# Patient Record
Sex: Female | Born: 1950 | Race: Black or African American | Hispanic: No | Smoking: Current every day smoker
Health system: Southern US, Community
[De-identification: ages and names within clinical notes are randomized; demographics above are authoritative.]

## PROBLEM LIST (undated history)

## (undated) DIAGNOSIS — K219 Gastro-esophageal reflux disease without esophagitis: Secondary | ICD-10-CM

## (undated) DIAGNOSIS — M25559 Pain in unspecified hip: Secondary | ICD-10-CM

## (undated) DIAGNOSIS — N183 Chronic kidney disease, stage 3 unspecified: Secondary | ICD-10-CM

## (undated) DIAGNOSIS — Z720 Tobacco use: Secondary | ICD-10-CM

## (undated) DIAGNOSIS — I509 Heart failure, unspecified: Secondary | ICD-10-CM

## (undated) DIAGNOSIS — I10 Essential (primary) hypertension: Secondary | ICD-10-CM

## (undated) DIAGNOSIS — I739 Peripheral vascular disease, unspecified: Secondary | ICD-10-CM

## (undated) DIAGNOSIS — I872 Venous insufficiency (chronic) (peripheral): Secondary | ICD-10-CM

## (undated) DIAGNOSIS — E119 Type 2 diabetes mellitus without complications: Secondary | ICD-10-CM

## (undated) DIAGNOSIS — T7840XA Allergy, unspecified, initial encounter: Secondary | ICD-10-CM

## (undated) DIAGNOSIS — R0609 Other forms of dyspnea: Secondary | ICD-10-CM

## (undated) DIAGNOSIS — E785 Hyperlipidemia, unspecified: Secondary | ICD-10-CM

## (undated) DIAGNOSIS — J309 Allergic rhinitis, unspecified: Secondary | ICD-10-CM

## (undated) DIAGNOSIS — E662 Morbid (severe) obesity with alveolar hypoventilation: Secondary | ICD-10-CM

## (undated) DIAGNOSIS — L602 Onychogryphosis: Secondary | ICD-10-CM

## (undated) DIAGNOSIS — K5900 Constipation, unspecified: Secondary | ICD-10-CM

## (undated) DIAGNOSIS — T300 Burn of unspecified body region, unspecified degree: Secondary | ICD-10-CM

## (undated) DIAGNOSIS — I6529 Occlusion and stenosis of unspecified carotid artery: Secondary | ICD-10-CM

## (undated) DIAGNOSIS — M179 Osteoarthritis of knee, unspecified: Secondary | ICD-10-CM

## (undated) DIAGNOSIS — G479 Sleep disorder, unspecified: Secondary | ICD-10-CM

## (undated) DIAGNOSIS — Z87442 Personal history of urinary calculi: Secondary | ICD-10-CM

## (undated) DIAGNOSIS — E079 Disorder of thyroid, unspecified: Secondary | ICD-10-CM

## (undated) DIAGNOSIS — Z9889 Other specified postprocedural states: Secondary | ICD-10-CM

## (undated) DIAGNOSIS — R112 Nausea with vomiting, unspecified: Secondary | ICD-10-CM

## (undated) DIAGNOSIS — J189 Pneumonia, unspecified organism: Secondary | ICD-10-CM

## (undated) DIAGNOSIS — E039 Hypothyroidism, unspecified: Secondary | ICD-10-CM

## (undated) DIAGNOSIS — F411 Generalized anxiety disorder: Secondary | ICD-10-CM

## (undated) HISTORY — DX: Hyperlipidemia, unspecified: E78.5

## (undated) HISTORY — DX: Venous insufficiency (chronic) (peripheral): I87.2

## (undated) HISTORY — DX: Gastro-esophageal reflux disease without esophagitis: K21.9

## (undated) HISTORY — DX: Other forms of dyspnea: R06.09

## (undated) HISTORY — DX: Type 2 diabetes mellitus without complications: E11.9

## (undated) HISTORY — DX: Generalized anxiety disorder: F41.1

## (undated) HISTORY — DX: Morbid (severe) obesity with alveolar hypoventilation: E66.2

## (undated) HISTORY — DX: Onychogryphosis: L60.2

## (undated) HISTORY — DX: Occlusion and stenosis of unspecified carotid artery: I65.29

## (undated) HISTORY — DX: Chronic kidney disease, stage 3 unspecified: N18.30

## (undated) HISTORY — DX: Sleep disorder, unspecified: G47.9

## (undated) HISTORY — DX: Disorder of thyroid, unspecified: E07.9

## (undated) HISTORY — DX: Osteoarthritis of knee, unspecified: M17.9

## (undated) HISTORY — DX: Allergy, unspecified, initial encounter: T78.40XA

## (undated) HISTORY — DX: Allergic rhinitis, unspecified: J30.9

## (undated) HISTORY — DX: Burn of unspecified body region, unspecified degree: T30.0

## (undated) HISTORY — DX: Peripheral vascular disease, unspecified: I73.9

## (undated) HISTORY — DX: Tobacco use: Z72.0

## (undated) HISTORY — DX: Constipation, unspecified: K59.00

## (undated) HISTORY — DX: Pain in unspecified hip: M25.559

## (undated) HISTORY — DX: Hypothyroidism, unspecified: E03.9

---

## 1998-03-19 ENCOUNTER — Inpatient Hospital Stay: Admission: EM | Admit: 1998-03-19 | Discharge: 1998-03-21 | Payer: Self-pay | Admitting: Emergency Medicine

## 1998-03-25 ENCOUNTER — Encounter (HOSPITAL_COMMUNITY): Admission: RE | Admit: 1998-03-25 | Discharge: 1998-06-23 | Payer: Self-pay | Admitting: Psychiatry

## 1998-08-24 ENCOUNTER — Encounter: Payer: Self-pay | Admitting: Emergency Medicine

## 1998-08-24 ENCOUNTER — Emergency Department (HOSPITAL_COMMUNITY): Admission: EM | Admit: 1998-08-24 | Discharge: 1998-08-24 | Payer: Self-pay | Admitting: Emergency Medicine

## 1999-12-01 ENCOUNTER — Inpatient Hospital Stay (HOSPITAL_COMMUNITY): Admission: EM | Admit: 1999-12-01 | Discharge: 1999-12-03 | Payer: Self-pay | Admitting: Emergency Medicine

## 1999-12-01 ENCOUNTER — Encounter: Payer: Self-pay | Admitting: Emergency Medicine

## 1999-12-02 ENCOUNTER — Encounter: Payer: Self-pay | Admitting: Internal Medicine

## 2001-06-30 ENCOUNTER — Other Ambulatory Visit: Admission: RE | Admit: 2001-06-30 | Discharge: 2001-06-30 | Payer: Self-pay | Admitting: Internal Medicine

## 2003-07-10 ENCOUNTER — Emergency Department (HOSPITAL_COMMUNITY): Admission: EM | Admit: 2003-07-10 | Discharge: 2003-07-11 | Payer: Self-pay | Admitting: *Deleted

## 2003-08-26 ENCOUNTER — Encounter: Admission: RE | Admit: 2003-08-26 | Discharge: 2003-08-26 | Payer: Self-pay | Admitting: Internal Medicine

## 2003-09-04 ENCOUNTER — Encounter: Admission: RE | Admit: 2003-09-04 | Discharge: 2003-09-04 | Payer: Self-pay | Admitting: Internal Medicine

## 2005-04-05 ENCOUNTER — Emergency Department (HOSPITAL_COMMUNITY): Admission: EM | Admit: 2005-04-05 | Discharge: 2005-04-05 | Payer: Self-pay | Admitting: *Deleted

## 2007-02-20 ENCOUNTER — Encounter: Admission: RE | Admit: 2007-02-20 | Discharge: 2007-02-20 | Payer: Self-pay | Admitting: Internal Medicine

## 2008-03-15 ENCOUNTER — Encounter: Admission: RE | Admit: 2008-03-15 | Discharge: 2008-03-15 | Payer: Self-pay | Admitting: Internal Medicine

## 2008-07-14 ENCOUNTER — Emergency Department (HOSPITAL_COMMUNITY): Admission: EM | Admit: 2008-07-14 | Discharge: 2008-07-14 | Payer: Self-pay | Admitting: Emergency Medicine

## 2009-04-22 ENCOUNTER — Encounter: Admission: RE | Admit: 2009-04-22 | Discharge: 2009-04-22 | Payer: Self-pay | Admitting: Internal Medicine

## 2010-06-14 ENCOUNTER — Encounter: Payer: Self-pay | Admitting: Internal Medicine

## 2010-06-23 ENCOUNTER — Ambulatory Visit
Admission: RE | Admit: 2010-06-23 | Discharge: 2010-06-23 | Payer: Self-pay | Source: Home / Self Care | Attending: Urology | Admitting: Urology

## 2010-06-23 LAB — POCT I-STAT 4, (NA,K, GLUC, HGB,HCT)
HCT: 41 % (ref 36.0–46.0)
Hemoglobin: 13.9 g/dL (ref 12.0–15.0)
Potassium: 3.5 mEq/L (ref 3.5–5.1)
Sodium: 144 mEq/L (ref 135–145)

## 2010-06-26 NOTE — Op Note (Signed)
NAMEGLENNDA, Heidi Maddox                 ACCOUNT NO.:  [id number]  MEDICAL RECORD NO.:  [id number]          PATIENT TYPE:  AMB  LOCATION:  [facility]                         FACILITY:  [facility]  PHYSICIAN:  Danae Chen, M.D.  DATE OF BIRTH:  08-Feb-1951  DATE OF PROCEDURE: DATE OF DISCHARGE:                              OPERATIVE REPORT   PREOPERATIVE DIAGNOSIS:  Left distal ureteral stone.  POSTOPERATIVE DIAGNOSIS:  Left distal ureteral stone.  PROCEDURE:  Cystoscopy, left retrograde pyelogram, ureteroscopy, holmium laser of left ureteral stone, stone extraction and insertion of double-J stent.  SURGEON:  Danae Chen, M.D.  ANESTHESIA:  General.  INDICATION:  The patient is a 60 year old year female who was found during workup of a recurrent urinary tract infection to have a 7 x 11 mm stone in the left distal ureter with moderate hydronephrosis.  She is scheduled today for cystoscopy, retrograde pyelogram, ureteroscopy, holmium laser of left ureteral stone and insertion of double-J stent. The procedure, the risks and benefits were discussed with the patient. The risks include, but are not limited to, hemorrhage, infection, ureteral injury, and inability to remove the stone.  She understands and gave informed consent.  The patient was identified by her wrist band, and proper time-out was taken.  Under general anesthesia she was prepped and draped and placed in the dorsal lithotomy position.  A panendoscope was inserted in the bladder. The bladder mucosa was normal.  There was no stone or tumor in the bladder.  The ureteral orifices were in normal position and shape.  RETROGRADE PYELOGRAM:  A cone-tip catheter was passed through the cystoscope and through the left ureteral orifice.  Contrast was then injected through the cone-tip catheter.  There was a large filling defect in the distal ureter at the UV junction.  The ureter proximal to the filling defect was moderately dilated.  The  cone-tip catheter was then removed.  A sensor wire was passed through the cystoscope but could not be advanced beyond the stone in the intramural ureter.  The sensor wire was removed.  A Glidewire was passed through an open-ended catheter, and the Glidewire was passed through the left ureteral orifice and advanced all the way up into the renal pelvis.  The open-ended catheter could not be advanced over the Glidewire.  The Glidewire was then left in place and used as a safety wire.  The cystoscope was removed.  A semi-rigid ureteroscope was then passed in the bladder and through the left ureteral orifice.  The stone was visualized in the distal ureter.  With a 365 micron fiber holmium laser, the stone was fragmented in multiple small stone fragments, and the stone fragments were removed with the nitinol basket.  The stone fragments were then dropped in the bladder. The ureteroscope was then passed in the ureter, and there was no evidence of remaining stone fragments in the ureter.  SECOND RETROGRADE PYELOGRAM:  Contrast was then injected through the ureteroscope, and there was no evidence of extravasation of contrast. The ureteroscope was then removed.  An open-ended catheter was then passed over the Glidewire, and the Glidewire was  replaced with a sensor wire.  Then the sensor wire was back-loaded into the cystoscope, and a #[id number]-French-24 double-J stent was passed over the sensor wire with proximal and distal curl of the double-J stent, respectively in the renal pelvis and in the bladder.  The guidewire was then removed.  All stone fragments were then irrigated out of the bladder.  The bladder was then emptied, and the cystoscope was removed.  The patient tolerated the procedure well and left the OR in satisfactory condition to post anesthesia care unit.     Danae Chen, M.D.     MN/MEDQ  D:  06/23/2010  T:  06/23/2010  Job:  [id number]  Electronically Signed by Lindaann Slough  M.D. on 06/26/2010 01:26:21 PM

## 2010-10-09 NOTE — Discharge Summary (Signed)
[facility]  Patient:    Heidi Maddox, Heidi Maddox                        MRN: [id number] Adm. Date:  [id number] Disc. Date: 12/03/99 Attending:  Edwyna Perfect Dictator:   Susie [place], M.D. CC:         Merlene Laughter. Renae Gloss, M.D., [facility]                           Discharge Summary  DISCHARGE DIAGNOSES: 1. Hypertensive urgency. 2. Presyncope.  DISCHARGE MEDICATIONS: 1. Norvasc 5 mg p.o. q.d. 2. Hydrochlorothiazide 25 mg p.o. q.d.  FOLLOWUP:  Appointment with Dr. Kellie Shropshire of [facility] on July 17 at 11:15 a.m.  PROCEDURE:  A 2-D echocardiogram done on July 11 revealed an EF of 50% and mild global hypokinesis in the left ventricular motion.  CONSULTATIONS:  Case management was consulted to help Ms. Eccleston evaluate her insurance status and ability to obtain medications on discharge.  Ms. Springston was informed that her insurance company did have a small minimum co-pay, and, upon learning that, Ms. Rapaport was reassurred that she would be able to obtain her medications.  BRIEF HISTORY AND PHYSICAL:  HISTORY OF PRESENT ILLNESS:  This is a 60 year old African-American female with a past medical history of hypertension and congestive heart failure who presents today with an episode of weakness and a feeling of lightheadedness. She reports that she was driving today, and she began to feel like she was going to pass out, and she loosened her clothing.  This episode lasted approximately 25 minutes and was associated with shortness of breath, palpitations per patient, diaphoresis, and shaking.  She also reported an earlier episode today when she was urinating when she also felt lightheaded and dizzy.  She denied numbness, tingling, headache, nausea, hypercholesterolemia, recent illness, focal deficits, and poor p.o.s.  She does state that she experienced blurry vision and nausea today and recalls a similar hospitalization  at [facility] where she was diagnosed with CHF. Finally, she also reports that she used to take Xanax for anxiety but is not currently taking any medication for anxiety.  PAST MEDICAL HISTORY: 1. Hypertension. 2. CHF. 3. History of a suicide attempt.  MEDICATIONS PRIOR TO ADMISSION:  Lasix (not taken since March 2001) and Lotrel (not taken since 1999).  PAST SURGICAL HISTORY:  Significant for tonsillectomy.  ALLERGIES:  No known drug allergies.  FAMILY MEDICAL HISTORY:  Significant for diabetes in brother, sister, and mother.  In addition, her mom had a coronary artery bypass graft x 3.  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  She lives here in [place] with a son and two daughters.  She is divorced.  She has a 20-pack-year history of tobacco use.  She also reports drinking five to six beers a day but denies IV drug use.  REVIEW OF SYSTEMS:  Significant for a 40-pound weight loss over the last year.  However, Ms. Rosman does report that she has put effort into losing weight. The remainder of the Review of Systems is negative.  PHYSICAL EXAMINATION:  VITAL SIGNS:  Temperature 98.2, respirations 20, O2 saturation 100% on room air, blood pressure 214/128, pulse 112.  GENERAL:  This is a well-developed, well-nourished, African-American female in no acute distress.  HEENT:  Oropharynx is clear without erythema.  Extraocular movements intact. Pupils are  equally round and reactive to light.  NECK:  No cervical lymphadenopathy.  RESPIRATORY:  Clear to auscultation bilaterally.  CARDIOVASCULAR:  Regular rate and rhythm without murmurs, rubs, or gallops.  ABDOMEN:  Soft.  Bowel sounds positive.  Nondistended, nontender.  RECTAL:  Heme negative.  EXTREMITIES:  Warm, dry, 2+ peripheral pulses, and there is no edema in the lower extremities bilaterally.  NEUROLOGIC:  Cranial nerves II-XII grossly intact.  No focal deficits. Reflexes are 2+ and symmetric  bilaterally in the biceps, brachial radialis, and quadriceps.  Sensation is grossly intact bilaterally.  Cerebellar function is grossly intact and symmetrical.  LABORATORY DATA ON ADMISSION:  BMP: Sodium 138, potassium 3.8, chloride 111, bicarb 21, BUN 11, creatinine 0.9.  CBC: White count 6.2, hemoglobin 13.8, MCV 88.2, platelets 306.  Total bilirubin 1.2, alkaline phosphatase 86, SGOT 55, SGPT 23.   CK-MB 106.  PT 12.4, PTT 27, INR 0.9.  Chest x-ray showed on acute disease.  EKG showed T wave inversions in III, V5, and V6.  On discussion with Dr. Jenne Campus in the ER, he felt that these were likely repolarization abnormalities.  HOSPITAL COURSE:  #1 - HYPERTENSIVE URGENCY:  On admission to the emergency room, Ms. Pattons blood pressure was 214/128 with a pulse of 112.  Ms. Blackwell was given a dose of IV push labetalol 20 mg after which her blood pressure came down to 187/85 with a pulse of 68.  She was started on hydrochlorothiazide 25 mg p.o. q.d., and an echo was ordered with the results in the previous section.  The following day, as Ms. Pattons blood pressure remained elevated, she was additionally started on Norvasc 5 mg p.o. q.d, and upon discharge, her blood pressure was 146/68.  Ms. Gibbs did rule out for MI while she was here with cardiac enzymes and serial EKGs.  In addition, she had a TSH and a lipid panel checked which were both normal.  However, because of the marked response to the dose of IV labetalol in addition to Ms. Pattons symptom history of palpitations, sweating, and flushes, it was felt that Ms. Kalina should be ruled out for a pheochromocytoma.  Because of the exceedingly rare incidence of this disorder, it is unlikely that she has it; however, we did feel that it was important to test her for this problem.  The results of 24-hour urine are pending.  Ms. Vasbinder has given [place] her phone number where she can be reached so that we can follow up with her on the  results of this test.  In addition, if they are abnormal, her primary care physician will be notified so that she can  follow up with her.  #2 - ANXIETY/MENOPAUSE:  Ms. Garron reports to [place] that she has had problems in the past with anxiety.  However, on further discussion, these periods were also associated with hot flashes and other symptoms associated with normal menopause.  Ms. Jester reports that her periods have become more scant over the last year; however, she has only missed one period.  She feels that, after talking with Dr. Aundria Rud, the attending further, that possibly the anxiety may be related to symptoms of menopause.  She reports that right now the anxiety is not bothering her to a great degree.  We felt that these symptoms of both anxiety and menopause, whether or not they are related, should be followed up by her primary care physician, Dr. Kellie Shropshire.  We did not start her on any medication such as  an anxiolytic or hormone replacement therapy as we will defer that to her primary care physician.  DISCHARGE LABORATORY DATA:  Sodium 137, potassium 3.8, chloride 103, bicarb 27, BUN 14, creatinine 0.9, glucose 101.  CONDITION ON DISCHARGE:  Good.  DISPOSITION:  To home.  RESIDENT:  Leory Plowman, M.D. and Susie [place], M.D.  ATTENDING:  C. Ulyess Mort, M.D. DD:  12/03/99 TD:  12/03/99 Job: [id number]

## 2011-02-16 ENCOUNTER — Other Ambulatory Visit: Payer: Self-pay | Admitting: Internal Medicine

## 2011-02-16 DIAGNOSIS — Z1231 Encounter for screening mammogram for malignant neoplasm of breast: Secondary | ICD-10-CM

## 2011-03-08 ENCOUNTER — Ambulatory Visit
Admission: RE | Admit: 2011-03-08 | Discharge: 2011-03-08 | Disposition: A | Discharge status: Home / Self Care | Payer: BC Managed Care – PPO | Source: Ambulatory Visit | Attending: Internal Medicine | Admitting: Internal Medicine

## 2011-03-08 ENCOUNTER — Other Ambulatory Visit: Payer: Self-pay | Admitting: Internal Medicine

## 2011-03-08 DIAGNOSIS — M25539 Pain in unspecified wrist: Secondary | ICD-10-CM

## 2011-05-25 ENCOUNTER — Telehealth (HOSPITAL_COMMUNITY): Payer: Self-pay | Admitting: *Deleted

## 2013-05-24 ENCOUNTER — Encounter (HOSPITAL_COMMUNITY): Payer: Self-pay | Admitting: Emergency Medicine

## 2013-05-24 ENCOUNTER — Emergency Department (INDEPENDENT_AMBULATORY_CARE_PROVIDER_SITE_OTHER): Payer: BC Managed Care – PPO

## 2013-05-24 ENCOUNTER — Emergency Department (INDEPENDENT_AMBULATORY_CARE_PROVIDER_SITE_OTHER)
Admission: EM | Admit: 2013-05-24 | Discharge: 2013-05-24 | Disposition: A | Discharge status: Home / Self Care | Payer: BC Managed Care – PPO | Source: Home / Self Care | Attending: Emergency Medicine | Admitting: Emergency Medicine

## 2013-05-24 DIAGNOSIS — J189 Pneumonia, unspecified organism: Secondary | ICD-10-CM

## 2013-05-24 MED ORDER — ONDANSETRON 8 MG PO TBDP: 8.0000 mg | ORAL_TABLET | Freq: Three times a day (TID) | ORAL | Status: DC | PRN

## 2013-05-24 MED ORDER — ACETAMINOPHEN 325 MG PO TABS
ORAL_TABLET | ORAL | Status: AC
  Filled 2013-05-24: qty 3

## 2013-05-24 MED ORDER — CEFTRIAXONE SODIUM 1 G IJ SOLR
1.0000 g | Freq: Once | INTRAMUSCULAR | Status: AC
  Administered 2013-05-24: 1 g via INTRAMUSCULAR

## 2013-05-24 MED ORDER — CEFTRIAXONE SODIUM 1 G IJ SOLR
INTRAMUSCULAR | Status: AC
  Filled 2013-05-24: qty 10

## 2013-05-24 MED ORDER — ACETAMINOPHEN 325 MG PO TABS
650.0000 mg | ORAL_TABLET | Freq: Once | ORAL | Status: AC
  Administered 2013-05-24: 650 mg via ORAL

## 2013-05-24 MED ORDER — LIDOCAINE HCL (PF) 1 % IJ SOLN
INTRAMUSCULAR | Status: AC
  Filled 2013-05-24: qty 5

## 2013-05-24 MED ORDER — AZITHROMYCIN 250 MG PO TABS: ORAL_TABLET | ORAL | Status: DC

## 2013-05-24 MED ORDER — HYDROCOD POLST-CHLORPHEN POLST 10-8 MG/5ML PO LQCR
5.0000 mL | Freq: Two times a day (BID) | ORAL | Status: DC | PRN
Start: 2013-05-24 — End: 2015-10-27

## 2013-05-24 NOTE — ED Provider Notes (Signed)
Chief Complaint   Chief Complaint  Patient presents with  . Influenza    History of Present Illness   Heidi Maddox is a 63 year old female who's had a three-day history of cough productive yellow-green sputum, difficulty breathing, temperature of up to 102.9, chills, sweats, myalgias, nasal congestion with white rhinorrhea, headache, sinus pressure, ear congestion, watery eyes, sore throat, nausea, vomiting, diarrhea, abdominal pain. She's had no known sick exposures.  Review of Systems   Other than as noted above, the patient denies any of the following symptoms: Systemic:  No fevers, chills, sweats, or myalgias. Eye:  No redness or discharge. ENT:  No ear pain, headache, nasal congestion, drainage, sinus pressure, or sore throat. Neck:  No neck pain, stiffness, or swollen glands. Lungs:  No cough, sputum production, hemoptysis, wheezing, chest tightness, shortness of breath or chest pain. GI:  No abdominal pain, nausea, vomiting or diarrhea.  PMFSH   Past medical history, family history, social history, meds, and allergies were reviewed. She has high blood pressure and takes several medications for that.  Physical exam   Vital signs:  BP 159/87  Pulse 107  Temp(Src) 102.9 F (39.4 C) (Oral)  Resp 22  SpO2 97% General:  Alert and oriented.  In no distress.  Skin warm and dry. Eye:  No conjunctival injection or drainage. Lids were normal. ENT:  TMs and canals were normal, without erythema or inflammation.  Nasal mucosa was clear and uncongested, without drainage.  Mucous membranes were moist.  Pharynx was clear with no exudate or drainage.  There were no oral ulcerations or lesions. Neck:  Supple, no adenopathy, tenderness or mass. Lungs:  No respiratory distress.  Lungs were clear to auscultation, without wheezes, rales or rhonchi.  Breath sounds were clear and equal bilaterally.  Heart:  Regular rhythm, without gallops, murmers or rubs. Skin:  Clear, warm, and dry, without  rash or lesions.   Radiology   Dg Chest 2 View  05/24/2013   CLINICAL DATA:  Sick for 3 days, cough, short of breath  EXAM: CHEST  2 VIEW  COMPARISON:  CT ABD-PELV WO/W CM ([facility]IA) dated 06/12/2010; DG CHEST 2 VIEW dated 07/14/2008  FINDINGS: Normal cardiac silhouette. Platelike atelectasis in left upper lobe inferiorly. Atelectasis also nodule along the oblique fissure inferiorly and anteriorly seen on the lateral projection. . Pleural fluid or pulmonary edema.  IMPRESSION: Platelike atelectasis in the left upper lobe and lower lower lobe along the fissures. Cannot exclude left lingular lobe pneumonia.   Electronically Signed   By: Genevive BiStewart  Edmunds M.D.   On: 05/24/2013 16:19    Course in Urgent Care Center   Given Rocephin 1 g IM.  Assessment     The encounter diagnosis was Community acquired pneumonia.  Plan    1.  Meds:  The following meds were prescribed:   Discharge Medication List as of 05/24/2013  4:32 PM    START taking these medications   Details  azithromycin (ZITHROMAX Z-PAK) 250 MG tablet Take as directed., Normal    chlorpheniramine-HYDROcodone (TUSSIONEX) 10-8 MG/5ML LQCR Take 5 mLs by mouth every 12 (twelve) hours as needed for cough., Starting 05/24/2013, Until Discontinued, Normal    ondansetron (ZOFRAN ODT) 8 MG disintegrating tablet Take 1 tablet (8 mg total) by mouth every 8 (eight) hours as needed for nausea., Starting 05/24/2013, Until Discontinued, Normal        2.  Patient Education/Counseling:  The patient was given appropriate handouts, self care instructions, and instructed in symptomatic relief.  Instructed to get extra fluids, rest, and use a cool mist vaporizer.   3.  Follow up:  The patient was told to follow up here in 2 days, or sooner if becoming worse in any way, and given some red flag symptoms such as increasing fever, difficulty breathing, chest pain, or persistent vomiting which would prompt immediate return.   Reuben Likes, MD 05/24/13 [phone]

## 2013-05-24 NOTE — Discharge Instructions (Signed)

## 2013-05-24 NOTE — ED Notes (Signed)
Body aches, fever, cough

## 2013-05-26 ENCOUNTER — Encounter (HOSPITAL_COMMUNITY): Payer: Self-pay | Admitting: Emergency Medicine

## 2013-05-26 ENCOUNTER — Emergency Department (INDEPENDENT_AMBULATORY_CARE_PROVIDER_SITE_OTHER)
Admission: EM | Admit: 2013-05-26 | Discharge: 2013-05-26 | Disposition: A | Discharge status: Home / Self Care | Payer: BC Managed Care – PPO | Source: Home / Self Care

## 2013-05-26 DIAGNOSIS — J020 Streptococcal pharyngitis: Secondary | ICD-10-CM

## 2013-05-26 LAB — POCT RAPID STREP A: Streptococcus, Group A Screen (Direct): POSITIVE — AB

## 2013-05-26 MED ORDER — CEFDINIR 300 MG PO CAPS: 300.0000 mg | ORAL_CAPSULE | Freq: Two times a day (BID) | ORAL | Status: DC

## 2013-05-26 NOTE — Discharge Instructions (Signed)
Follow up with your primary care provider.  You should schedule a follow up xray for next week sometime.  Strep Throat Strep throat is an infection of the throat caused by a bacteria named Streptococcus pyogenes. Your caregiver may call the infection streptococcal "tonsillitis" or "pharyngitis" depending on whether there are signs of inflammation in the tonsils or back of the throat. Strep throat is most common in children aged 62 15 years during the cold months of the year, but it can occur in people of any age during any season. This infection is spread from person to person (contagious) through coughing, sneezing, or other close contact. SYMPTOMS   Fever or chills.  Painful, swollen, red tonsils or throat.  Pain or difficulty when swallowing.  White or yellow spots on the tonsils or throat.  Swollen, tender lymph nodes or "glands" of the neck or under the jaw.  Red rash all over the body (rare). DIAGNOSIS  Many different infections can cause the same symptoms. A test must be done to confirm the diagnosis so the right treatment can be given. A "rapid strep test" can help your caregiver make the diagnosis in a few minutes. If this test is not available, a light swab of the infected area can be used for a throat culture test. If a throat culture test is done, results are usually available in a day or two. TREATMENT  Strep throat is treated with antibiotic medicine. HOME CARE INSTRUCTIONS   Gargle with 1 tsp of salt in 1 cup of warm water, 3 4 times per day or as needed for comfort.  Family members who also have a sore throat or fever should be tested for strep throat and treated with antibiotics if they have the strep infection.  Make sure everyone in your household washes their hands well.  Do not share food, drinking cups, or personal items that could cause the infection to spread to others.  You may need to eat a soft food diet until your sore throat gets better.  Drink enough  water and fluids to keep your urine clear or pale yellow. This will help prevent dehydration.  Get plenty of rest.  Stay home from school, daycare, or work until you have been on antibiotics for 24 hours.  Only take over-the-counter or prescription medicines for pain, discomfort, or fever as directed by your caregiver.  If antibiotics are prescribed, take them as directed. Finish them even if you start to feel better. SEEK MEDICAL CARE IF:   The glands in your neck continue to enlarge.  You develop a rash, cough, or earache.  You cough up green, yellow-brown, or bloody sputum.  You have pain or discomfort not controlled by medicines.  Your problems seem to be getting worse rather than better. SEEK IMMEDIATE MEDICAL CARE IF:   You develop any new symptoms such as vomiting, severe headache, stiff or painful neck, chest pain, shortness of breath, or trouble swallowing.  You develop severe throat pain, drooling, or changes in your voice.  You develop swelling of the neck, or the skin on the neck becomes red and tender.  You have a fever.  You develop signs of dehydration, such as fatigue, dry mouth, and decreased urination.  You become increasingly sleepy, or you cannot wake up completely. Document Released: 05/07/2000 Document Revised: 04/26/2012 Document Reviewed: 07/09/2010 [facility] Patient Information 2014 [employer], [place].

## 2013-05-26 NOTE — ED Notes (Signed)
Follow up on pneumonia Was seen here on 05/24/13 States throat is sore than normal

## 2013-05-26 NOTE — ED Provider Notes (Signed)
CSN: [id number]     Arrival date & time 05/26/13  1030 History   None    Chief Complaint  Patient presents with  . Pneumonia   (Consider location/radiation/quality/duration/timing/severity/associated sxs/prior Treatment)  HPI  The patient presents today for a "pneumonia followup". Patient states she was seen here and told she needed to followup for pneumonia here today.  She denies any active shortness of breath chest pain or pressure states she does have some discomfort in the right lower lobe with "very deep breaths".  Denies difficulty sleeping or lying flat, however reports severe discomfort with sore throat.  History reviewed. No pertinent past medical history. History reviewed. No pertinent past surgical history. History reviewed. No pertinent family history. History  Substance Use Topics  . Smoking status: Current Every Day Smoker  . Smokeless tobacco: Not on file  . Alcohol Use: No   OB History   Grav Para Term Preterm Abortions TAB SAB Ect Mult Living                 Review of Systems  Constitutional: Negative.   HENT: Positive for congestion and sore throat. Negative for ear pain, sinus pressure, sneezing and trouble swallowing.   Eyes: Negative.   Respiratory: Positive for cough. Negative for apnea, chest tightness, shortness of breath and wheezing.   Cardiovascular: Negative for chest pain, palpitations and leg swelling.       Patient reports a history of hypertension and high cholesterol for which she sees Dr. Renae GlossShelton and takes medication.  Gastrointestinal: Negative.   Endocrine: Negative.   Genitourinary: Negative.   Musculoskeletal: Negative.   Skin: Negative.   Allergic/Immunologic: Negative.   Neurological: Negative.   Hematological: Negative.   Psychiatric/Behavioral: Negative.     Allergies  Review of patient's allergies indicates no known allergies.  Home Medications   Current Outpatient Rx  Name  Route  Sig  Dispense  Refill  . azithromycin  (ZITHROMAX Z-PAK) 250 MG tablet      Take as directed.   6 tablet   0   . cefdinir (OMNICEF) 300 MG capsule   Oral   Take 1 capsule (300 mg total) by mouth 2 (two) times daily.   20 capsule   0   . chlorpheniramine-HYDROcodone (TUSSIONEX) 10-8 MG/5ML LQCR   Oral   Take 5 mLs by mouth every 12 (twelve) hours as needed for cough.   140 mL   0   . ondansetron (ZOFRAN ODT) 8 MG disintegrating tablet   Oral   Take 1 tablet (8 mg total) by mouth every 8 (eight) hours as needed for nausea.   20 tablet   0    Pulse 91  Temp(Src) 98.2 F (36.8 C) (Oral)  Resp 18  SpO2 96%   Physical Exam  Nursing note and vitals reviewed. Constitutional: She is oriented to person, place, and time. She appears well-developed and well-nourished. No distress.  HENT:  Head: Normocephalic and atraumatic.  Right Ear: Tympanic membrane and external ear normal.  Left Ear: Tympanic membrane and external ear normal.  Mouth/Throat: Uvula is midline. Mucous membranes are not pale and not dry. Posterior oropharyngeal erythema present.  The patient presents with moderate erythema and tonsillar enlargement approximately 2+.  White patchy exudate noted scattered throughout posterior oropharynx and tonsils.  Bilateral tympanic membranes pearly gray with bony prominences visualized. Positive light reflexes present bilaterally.  Neck: Normal range of motion. Neck supple. No thyromegaly present.  Moderate anterior cervical lymphadenopathy present.  Cardiovascular: Normal rate  and intact distal pulses.  Exam reveals no gallop and no friction rub.   No murmur heard. Pulmonary/Chest: Effort normal and breath sounds normal. No respiratory distress. She has no wheezes. She has no rales. She exhibits no tenderness.  Negative E to A changes with egophony no adventitious breath sounds or evidence of consolidation noted.  Abdominal: Soft. Bowel sounds are normal. She exhibits no distension. There is no tenderness. There  is no rebound and no guarding.  Musculoskeletal: Normal range of motion.  Neurological: She is alert and oriented to person, place, and time.  Skin: Skin is warm and dry. No rash noted. She is not diaphoretic. No erythema. No pallor.  Psychiatric: She has a normal mood and affect. Her behavior is normal.    ED Course  Procedures (including critical care time) Labs Review Labs Reviewed  POCT RAPID STREP A ([facility] CARE ONLY) - Abnormal; Notable for the following:    Streptococcus, Group A Screen (Direct) POSITIVE (*)    All other components within normal limits   Imaging Review Dg Chest 2 View  05/24/2013   CLINICAL DATA:  Sick for 3 days, cough, short of breath  EXAM: CHEST  2 VIEW  COMPARISON:  CT ABD-PELV WO/W CM ([facility]) dated 06/12/2010; DG CHEST 2 VIEW dated 07/14/2008  FINDINGS: Normal cardiac silhouette. Platelike atelectasis in left upper lobe inferiorly. Atelectasis also nodule along the oblique fissure inferiorly and anteriorly seen on the lateral projection. . Pleural fluid or pulmonary edema.  IMPRESSION: Platelike atelectasis in the left upper lobe and lower lower lobe along the fissures. Cannot exclude left lingular lobe pneumonia.   Electronically Signed   By: Genevive Bi M.D.   On: 05/24/2013 16:19     MDM   1. Strep pharyngitis     Meds ordered this encounter  Medications  . cefdinir (OMNICEF) 300 MG capsule    Sig: Take 1 capsule (300 mg total) by mouth 2 (two) times daily.    Dispense:  20 capsule    Refill:  0   Plan of care discussed with Dr. Artis Flock.  She advised follow up with primary care provider for followup x-ray regarding pnuemonia next week.      Weber Cooks, NP 05/26/13 1324  Weber Cooks, NP 05/26/13 1545

## 2013-05-29 NOTE — ED Provider Notes (Signed)
Medical screening examination/treatment/procedure(s) were performed by resident physician or non-physician practitioner and as supervising physician I was immediately available for consultation/collaboration.   KINDL,JAMES DOUGLAS MD.   James D Kindl, MD 05/29/13 0844 

## 2013-06-25 ENCOUNTER — Other Ambulatory Visit: Payer: Self-pay

## 2013-06-25 DIAGNOSIS — Z1231 Encounter for screening mammogram for malignant neoplasm of breast: Secondary | ICD-10-CM

## 2013-07-10 ENCOUNTER — Inpatient Hospital Stay: Admission: RE | Admit: 2013-07-10 | Payer: BC Managed Care – PPO | Source: Ambulatory Visit

## 2013-07-25 ENCOUNTER — Ambulatory Visit
Admission: RE | Admit: 2013-07-25 | Discharge: 2013-07-25 | Disposition: A | Discharge status: Home / Self Care | Payer: BC Managed Care – PPO | Source: Ambulatory Visit

## 2013-07-25 DIAGNOSIS — Z1231 Encounter for screening mammogram for malignant neoplasm of breast: Secondary | ICD-10-CM

## 2013-07-26 ENCOUNTER — Ambulatory Visit: Payer: BC Managed Care – PPO

## 2015-01-06 ENCOUNTER — Other Ambulatory Visit (HOSPITAL_COMMUNITY)
Admission: RE | Admit: 2015-01-06 | Discharge: 2015-01-06 | Disposition: A | Discharge status: Home / Self Care | Payer: BC Managed Care – PPO | Source: Ambulatory Visit | Attending: Family Medicine | Admitting: Family Medicine

## 2015-01-06 ENCOUNTER — Other Ambulatory Visit: Payer: Self-pay | Admitting: Family Medicine

## 2015-01-06 DIAGNOSIS — Z01419 Encounter for gynecological examination (general) (routine) without abnormal findings: Secondary | ICD-10-CM | POA: Insufficient documentation

## 2015-01-07 LAB — CYTOLOGY - PAP

## 2015-02-20 IMAGING — CR DG CHEST 2V
2 series · 2 of 2 positions shown · non-contrast
Comparison: CT ABD-PELV WO/W CM ([facility]) dated 06/12/2010; DG
CHEST 2 VIEW dated 07/14/2008

CLINICAL DATA: Sick for 3 days, cough, short of breath

EXAM:
CHEST  2 VIEW

[view not recorded (1 of 2)]
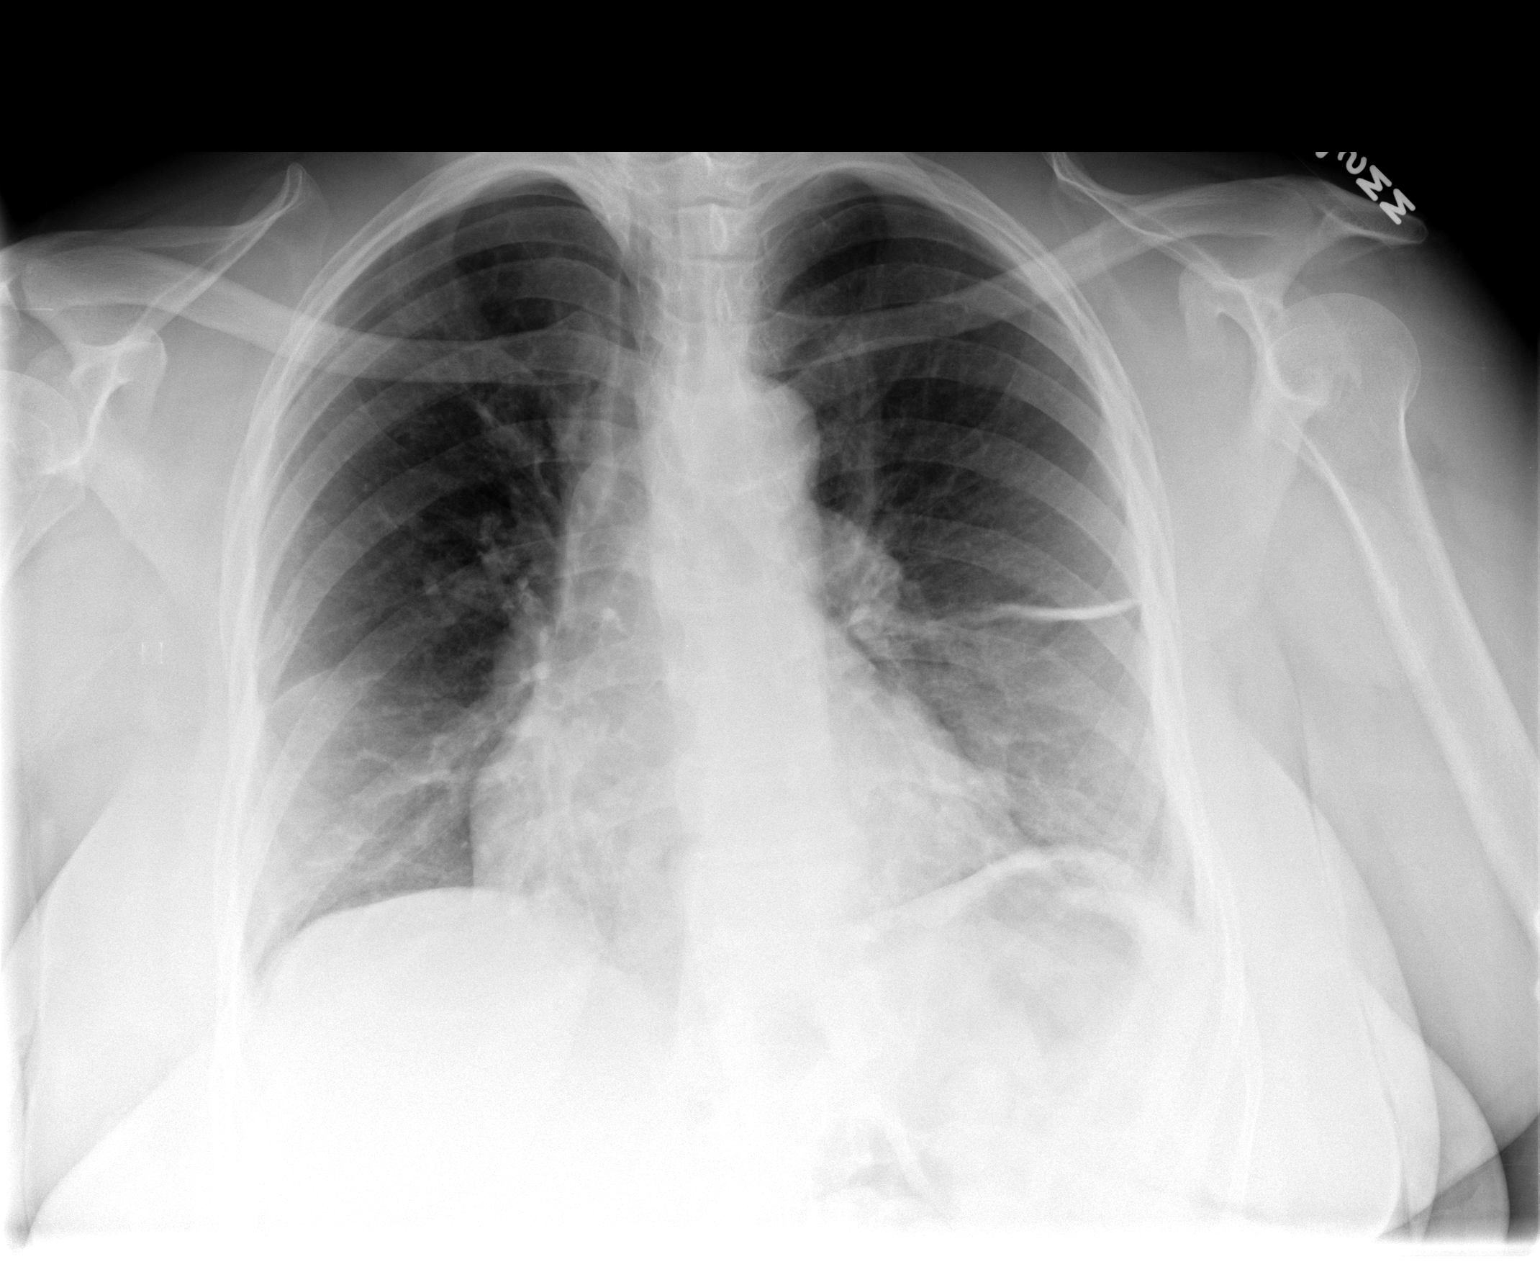

[view not recorded (2 of 2)]
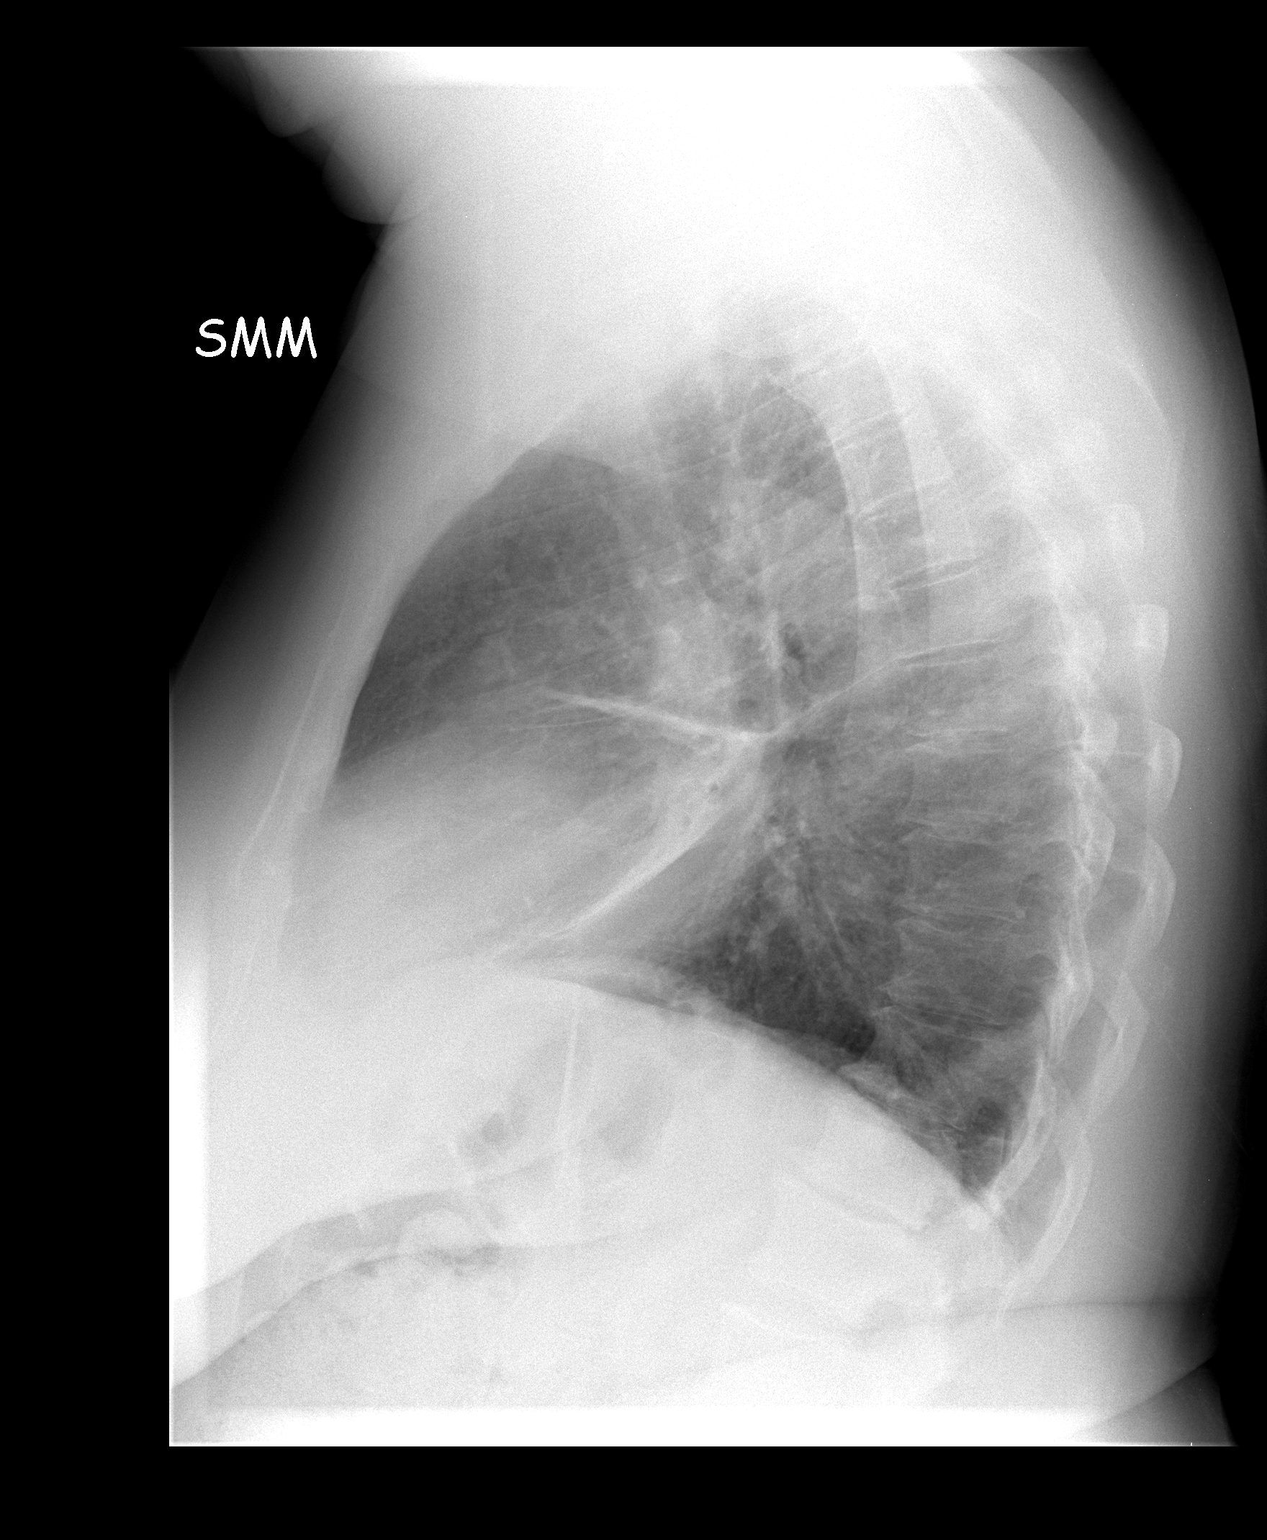

[2 of 2 positions shown; findings below may reference images not displayed]

FINDINGS: Normal cardiac silhouette. Platelike atelectasis in left upper lobe
inferiorly. Atelectasis also nodule along the oblique fissure
inferiorly and anteriorly seen on the lateral projection. . Pleural
fluid or pulmonary edema.
IMPRESSION: Platelike atelectasis in the left upper lobe and lower lower lobe
along the fissures. Cannot exclude left lingular lobe pneumonia.

## 2015-10-23 ENCOUNTER — Telehealth: Payer: Self-pay | Admitting: Family

## 2015-10-23 NOTE — Telephone Encounter (Signed)
error:[id number] ° °

## 2015-10-27 ENCOUNTER — Emergency Department (HOSPITAL_COMMUNITY)
Admission: EM | Admit: 2015-10-27 | Discharge: 2015-10-27 | Disposition: A | Discharge status: Home / Self Care | Payer: BC Managed Care – PPO | Attending: Emergency Medicine | Admitting: Emergency Medicine

## 2015-10-27 ENCOUNTER — Emergency Department (HOSPITAL_COMMUNITY): Payer: BC Managed Care – PPO

## 2015-10-27 ENCOUNTER — Encounter (HOSPITAL_COMMUNITY): Payer: Self-pay | Admitting: Emergency Medicine

## 2015-10-27 DIAGNOSIS — Z7982 Long term (current) use of aspirin: Secondary | ICD-10-CM | POA: Diagnosis not present

## 2015-10-27 DIAGNOSIS — M545 Low back pain, unspecified: Secondary | ICD-10-CM

## 2015-10-27 DIAGNOSIS — I509 Heart failure, unspecified: Secondary | ICD-10-CM | POA: Insufficient documentation

## 2015-10-27 DIAGNOSIS — K5900 Constipation, unspecified: Secondary | ICD-10-CM | POA: Insufficient documentation

## 2015-10-27 DIAGNOSIS — I110 Hypertensive heart disease with heart failure: Secondary | ICD-10-CM | POA: Insufficient documentation

## 2015-10-27 DIAGNOSIS — F17200 Nicotine dependence, unspecified, uncomplicated: Secondary | ICD-10-CM | POA: Diagnosis not present

## 2015-10-27 HISTORY — DX: Essential (primary) hypertension: I10

## 2015-10-27 HISTORY — DX: Heart failure, unspecified: I50.9

## 2015-10-27 LAB — COMPREHENSIVE METABOLIC PANEL
ALK PHOS: 76 U/L (ref 38–126)
ALT: 20 U/L (ref 14–54)
ANION GAP: 8 (ref 5–15)
AST: 20 U/L (ref 15–41)
Albumin: 3.8 g/dL (ref 3.5–5.0)
BILIRUBIN TOTAL: 0.6 mg/dL (ref 0.3–1.2)
BUN: 12 mg/dL (ref 6–20)
CALCIUM: 9.3 mg/dL (ref 8.9–10.3)
CO2: 26 mmol/L (ref 22–32)
Chloride: 106 mmol/L (ref 101–111)
Creatinine, Ser: 0.85 mg/dL (ref 0.44–1.00)
Glucose, Bld: 100 mg/dL — ABNORMAL HIGH (ref 65–99)
Potassium: 3 mmol/L — ABNORMAL LOW (ref 3.5–5.1)
Sodium: 140 mmol/L (ref 135–145)
TOTAL PROTEIN: 7.3 g/dL (ref 6.5–8.1)

## 2015-10-27 LAB — URINALYSIS, ROUTINE W REFLEX MICROSCOPIC
BILIRUBIN URINE: NEGATIVE
Glucose, UA: NEGATIVE mg/dL
Hgb urine dipstick: NEGATIVE
KETONES UR: NEGATIVE mg/dL
NITRITE: NEGATIVE
Protein, ur: NEGATIVE mg/dL
Specific Gravity, Urine: 1.022 (ref 1.005–1.030)
pH: 6 (ref 5.0–8.0)

## 2015-10-27 LAB — URINE MICROSCOPIC-ADD ON

## 2015-10-27 LAB — CBC WITH DIFFERENTIAL/PLATELET
Basophils Absolute: 0 10*3/uL (ref 0.0–0.1)
Basophils Relative: 0 %
EOS ABS: 0.1 10*3/uL (ref 0.0–0.7)
Eosinophils Relative: 2 %
HEMATOCRIT: 37.5 % (ref 36.0–46.0)
HEMOGLOBIN: 12.3 g/dL (ref 12.0–15.0)
LYMPHS ABS: 2.8 10*3/uL (ref 0.7–4.0)
Lymphocytes Relative: 41 %
MCH: 26.9 pg (ref 26.0–34.0)
MCHC: 32.8 g/dL (ref 30.0–36.0)
MCV: 81.9 fL (ref 78.0–100.0)
MONOS PCT: 6 %
Monocytes Absolute: 0.4 10*3/uL (ref 0.1–1.0)
NEUTROS ABS: 3.5 10*3/uL (ref 1.7–7.7)
NEUTROS PCT: 51 %
Platelets: 284 10*3/uL (ref 150–400)
RBC: 4.58 MIL/uL (ref 3.87–5.11)
RDW: 15 % (ref 11.5–15.5)
WBC: 6.8 10*3/uL (ref 4.0–10.5)

## 2015-10-27 LAB — LIPASE, BLOOD: Lipase: 21 U/L (ref 11–51)

## 2015-10-27 MED ORDER — MORPHINE SULFATE (PF) 2 MG/ML IV SOLN: 2.0000 mg | Freq: Once | INTRAVENOUS | Status: DC

## 2015-10-27 MED ORDER — ACETAMINOPHEN 500 MG PO TABS
1000.0000 mg | ORAL_TABLET | Freq: Once | ORAL | Status: AC
  Administered 2015-10-27: 1000 mg via ORAL
  Filled 2015-10-27: qty 2

## 2015-10-27 MED ORDER — SODIUM CHLORIDE 0.9 % IV BOLUS (SEPSIS)
1000.0000 mL | Freq: Once | INTRAVENOUS | Status: AC
  Administered 2015-10-27: 1000 mL via INTRAVENOUS

## 2015-10-27 MED ORDER — ONDANSETRON HCL 4 MG/2ML IJ SOLN: 4.0000 mg | Freq: Once | INTRAMUSCULAR | Status: DC

## 2015-10-27 MED ORDER — OXYCODONE HCL 5 MG PO TABS: 2.5000 mg | ORAL_TABLET | ORAL | Status: DC | PRN

## 2015-10-27 NOTE — ED Notes (Signed)
Patient transported to CT 

## 2015-10-27 NOTE — Discharge Instructions (Signed)
Use tylenol two tabs 4 times a day.  If you doctor says its ok to take NSAIDs take naproxen 2 tablets twice a day. Back Pain, Adult Back pain is very common in adults.The cause of back pain is rarely dangerous and the pain often gets better over time.The cause of your back pain may not be known. Some common causes of back pain include:  Strain of the muscles or ligaments supporting the spine.  Wear and tear (degeneration) of the spinal disks.  Arthritis.  Direct injury to the back. For many people, back pain may return. Since back pain is rarely dangerous, most people can learn to manage this condition on their own. HOME CARE INSTRUCTIONS Watch your back pain for any changes. The following actions may help to lessen any discomfort you are feeling:  Remain active. It is stressful on your back to sit or stand in one place for long periods of time. Do not sit, drive, or stand in one place for more than 30 minutes at a time. Take short walks on even surfaces as soon as you are able.Try to increase the length of time you walk each day.  Exercise regularly as directed by your health care provider. Exercise helps your back heal faster. It also helps avoid future injury by keeping your muscles strong and flexible.  Do not stay in bed.Resting more than 1-2 days can delay your recovery.  Pay attention to your body when you bend and lift. The most comfortable positions are those that put less stress on your recovering back. Always use proper lifting techniques, including:  Bending your knees.  Keeping the load close to your body.  Avoiding twisting.  Find a comfortable position to sleep. Use a firm mattress and lie on your side with your knees slightly bent. If you lie on your back, put a pillow under your knees.  Avoid feeling anxious or stressed.Stress increases muscle tension and can worsen back pain.It is important to recognize when you are anxious or stressed and learn ways to manage  it, such as with exercise.  Take medicines only as directed by your health care provider. Over-the-counter medicines to reduce pain and inflammation are often the most helpful.Your health care provider may prescribe muscle relaxant drugs.These medicines help dull your pain so you can more quickly return to your normal activities and healthy exercise.  Apply ice to the injured area:  Put ice in a plastic bag.  Place a towel between your skin and the bag.  Leave the ice on for 20 minutes, 2-3 times a day for the first 2-3 days. After that, ice and heat may be alternated to reduce pain and spasms.  Maintain a healthy weight. Excess weight puts extra stress on your back and makes it difficult to maintain good posture. SEEK MEDICAL CARE IF:  You have pain that is not relieved with rest or medicine.  You have increasing pain going down into the legs or buttocks.  You have pain that does not improve in one week.  You have night pain.  You lose weight.  You have a fever or chills. SEEK IMMEDIATE MEDICAL CARE IF:   You develop new bowel or bladder control problems.  You have unusual weakness or numbness in your arms or legs.  You develop nausea or vomiting.  You develop abdominal pain.  You feel faint.   This information is not intended to replace advice given to you by your health care provider. Make sure you discuss any  questions you have with your health care provider.   Document Released: 05/10/2005 Document Revised: 05/31/2014 Document Reviewed: 09/11/2013 Elsevier Interactive Patient Education [employer].

## 2015-10-27 NOTE — ED Notes (Signed)
Pt states that she is having lower left back pain since last Thursday.  Worse with movement.  Denies injury.  Pt states that she has also been constipated with last BM being on Saturday after taking a laxative. Denies NV.

## 2015-10-27 NOTE — ED Provider Notes (Signed)
CSN: [id number]     Arrival date & time 10/27/15  1054 History   First MD Initiated Contact with Patient 10/27/15 1251     Chief Complaint  Patient presents with  . Back Pain  . Constipation     (Consider location/radiation/quality/duration/timing/severity/associated sxs/prior Treatment) Patient is a 65 y.o. female presenting with back pain. The history is provided by the patient.  Back Pain Location:  Lumbar spine Quality:  Aching and cramping Radiates to:  Does not radiate Pain severity:  Moderate Onset quality:  Gradual Duration:  3 days Timing:  Intermittent Progression:  Worsening Chronicity:  New Relieved by:  Nothing Worsened by:  Nothing tried Ineffective treatments:  None tried Associated symptoms: no chest pain, no dysuria, no fever and no headaches    65 yo F With a chief complaint of left flank pain. This been going on for the past couple days. Patient thinks it's like a kidney stone she had in the past. The stent had to be removed surgically. Patient denies any fevers or chills denies dysuria or hesitancy or increased frequency. Denies abdominal pain. The pain is worse with movement and twisting. She said that is consistent with her prior stone.  Past Medical History  Diagnosis Date  . Hypertension   . CHF (congestive heart failure) (HCC)    No past surgical history on file. No family history on file. Social History  Substance Use Topics  . Smoking status: Current Every Day Smoker  . Smokeless tobacco: None  . Alcohol Use: No   OB History    No data available     Review of Systems  Constitutional: Negative for fever and chills.  HENT: Negative for congestion and rhinorrhea.   Eyes: Negative for redness and visual disturbance.  Respiratory: Negative for shortness of breath and wheezing.   Cardiovascular: Negative for chest pain and palpitations.  Gastrointestinal: Negative for nausea and vomiting.  Genitourinary: Negative for dysuria and urgency.   Musculoskeletal: Positive for back pain and arthralgias. Negative for myalgias.  Skin: Negative for pallor and wound.  Neurological: Negative for dizziness and headaches.      Allergies  Review of patient's allergies indicates no known allergies.  Home Medications   Prior to Admission medications   Medication Sig Start Date End Date Taking? Authorizing Provider  aspirin EC 81 MG tablet Take 81 mg by mouth daily.   Yes Historical Provider, MD  cholecalciferol (VITAMIN D) 1000 units tablet Take 1,000 Units by mouth daily.   Yes Historical Provider, MD  furosemide (LASIX) 20 MG tablet Take 20 mg by mouth daily as needed for fluid or edema.  09/17/15  Yes Historical Provider, MD  levothyroxine (SYNTHROID, LEVOTHROID) 25 MCG tablet Take 25 mcg by mouth daily. 10/06/15  Yes Historical Provider, MD  Multiple Vitamins-Minerals (MULTIVITAMIN ADULT PO) Take 1 tablet by mouth daily.   Yes Historical Provider, MD  Omega-3 Fatty Acids (FISH OIL) 1000 MG CAPS Take 1,000 mg by mouth daily.   Yes Historical Provider, MD  rosuvastatin (CRESTOR) 40 MG tablet Take 40 mg by mouth daily. 09/18/15  Yes Historical Provider, MD  sertraline (ZOLOFT) 100 MG tablet Take 100 mg by mouth daily. 10/16/15  Yes Historical Provider, MD  traMADol (ULTRAM) 50 MG tablet Take 50-100 mg by mouth every 8 (eight) hours as needed for moderate pain or severe pain.  09/02/15  Yes Historical Provider, MD  TRIBENZOR 40-10-25 MG TABS Take 1 tablet by mouth daily. 10/06/15  Yes Historical Provider, MD  oxyCODONE (  ROXICODONE) 5 MG immediate release tablet Take 0.5 tablets (2.5 mg total) by mouth every 4 (four) hours as needed for severe pain. 10/27/15   Melene Plan, DO   BP 138/67 mmHg  Pulse 61  Temp(Src) 97.5 F (36.4 C) (Oral)  Resp 16  SpO2 98% Physical Exam  Constitutional: She is oriented to person, place, and time. She appears well-developed and well-nourished. No distress.  Obese  HENT:  Head: Normocephalic and atraumatic.   Eyes: EOM are normal. Pupils are equal, round, and reactive to light.  Neck: Normal range of motion. Neck supple.  Cardiovascular: Normal rate and regular rhythm.  Exam reveals no gallop and no friction rub.   No murmur heard. Pulmonary/Chest: Effort normal. She has no wheezes. She has no rales.  Abdominal: Soft. She exhibits no distension. There is no tenderness. There is no rebound and no guarding.  Musculoskeletal: She exhibits no edema or tenderness.  Neurological: She is alert and oriented to person, place, and time.  Skin: Skin is warm and dry. She is not diaphoretic.  Psychiatric: She has a normal mood and affect. Her behavior is normal.  Nursing note and vitals reviewed.   ED Course  Procedures (including critical care time) Labs Review Labs Reviewed  COMPREHENSIVE METABOLIC PANEL - Abnormal; Notable for the following:    Potassium 3.0 (*)    Glucose, Bld 100 (*)    All other components within normal limits  URINALYSIS, ROUTINE W REFLEX MICROSCOPIC (NOT AT [facility]) - Abnormal; Notable for the following:    Leukocytes, UA SMALL (*)    All other components within normal limits  URINE MICROSCOPIC-ADD ON - Abnormal; Notable for the following:    Squamous Epithelial / LPF 0-5 (*)    Bacteria, UA FEW (*)    All other components within normal limits  CBC WITH DIFFERENTIAL/PLATELET  LIPASE, BLOOD    Imaging Review Ct Renal Stone Study  10/27/2015  CLINICAL DATA:  Left flank pain. EXAM: CT ABDOMEN AND PELVIS WITHOUT CONTRAST TECHNIQUE: Multidetector CT imaging of the abdomen and pelvis was performed following the standard protocol without IV contrast. COMPARISON:  06/12/2010 CT abdomen/pelvis. FINDINGS: Lower chest: No significant pulmonary nodules or acute consolidative airspace disease. Hepatobiliary: Mild-to-moderate diffuse hepatic steatosis. Normal liver size. No liver mass. Normal gallbladder with no radiopaque cholelithiasis. No biliary ductal dilatation. Pancreas: Normal, with  no mass or duct dilation. Spleen: Normal size. No mass. Adrenals/Urinary Tract: Normal adrenals. No hydronephrosis. No definite right renal stones. Punctate calcifications in the right renal sinus are favored to represent vascular calcifications. Nonobstructing 5 mm interpolar and 3 mm lower left renal stones. Stable contour irregularity in the mid to lower left kidney suggestive of mild chronic scarring. No contour deforming renal masses. Normal caliber ureters, with no ureteral stones. Normal bladder. Stomach/Bowel: Small to moderate hiatal hernia, increased. Otherwise collapsed and grossly normal stomach. Normal caliber small bowel with no small bowel wall thickening. Normal appendix. Normal large bowel with no diverticulosis, large bowel wall thickening or pericolonic fat stranding. Vascular/Lymphatic: Mildly ectatic atherosclerotic infrarenal abdominal aorta, maximum diameter 2.6 cm, unchanged. No pathologically enlarged lymph nodes in the abdomen or pelvis. Reproductive: Grossly normal uterus.  No adnexal mass. Other: No pneumoperitoneum, ascites or focal fluid collection. Musculoskeletal: No aggressive appearing focal osseous lesions. Moderate degenerative changes in the visualized thoracolumbar spine. IMPRESSION: 1. Nonobstructing left renal stones. No hydronephrosis. No ureteral stones. 2. No evidence of bowel obstruction or acute bowel inflammation. Normal appendix. 3. Diffuse hepatic steatosis . 4. Small  to moderate hiatal hernia. 5. Stable mildly ectatic atherosclerotic infrarenal abdominal aorta, maximum diameter 2.6 cm. Ectatic abdominal aorta at risk for aneurysm development. Recommend followup by ultrasound in 5 years. This recommendation follows ACR consensus guidelines: White Paper of the ACR Incidental Findings Committee II on Vascular Findings. J Am Coll Radiol 2013; 10:789-794. Electronically Signed   By: Delbert PhenixJason A Poff M.D.   On: 10/27/2015 14:19   I have personally reviewed and evaluated these  images and lab results as part of my medical decision-making.   EKG Interpretation None      MDM   Final diagnoses:  Acute left-sided low back pain without sciatica    65 yo F with a chief complaint of left flank pain. Patient's pain seems atypical of kidney stones though she says this is similar to her prior presentation. She is well-appearing and nontoxic and does not appear to be in severe distress on my exam. Will obtain a stone study to evaluate for possible stone. Suspect musculoskeletal based on her history.  CT scan without stone in ureter.  Will have follow up with PCP.  5:46 PM:  I have discussed the diagnosis/risks/treatment options with the patient and family and believe the pt to be eligible for discharge home to follow-up with PCP. We also discussed returning to the ED immediately if new or worsening sx occur. We discussed the sx which are most concerning (e.g., sudden worsening pain, fever, inability to tolerate by mouth) that necessitate immediate return. Medications administered to the patient during their visit and any new prescriptions provided to the patient are listed below.  Medications given during this visit Medications  morphine 2 MG/ML injection 2 mg (2 mg Intravenous Not Given 10/27/15 1352)  ondansetron (ZOFRAN) injection 4 mg (4 mg Intravenous Not Given 10/27/15 1352)  sodium chloride 0.9 % bolus 1,000 mL (0 mLs Intravenous Stopped 10/27/15 1520)  acetaminophen (TYLENOL) tablet 1,000 mg (1,000 mg Oral Given 10/27/15 1415)    Discharge Medication List as of 10/27/2015  4:18 PM    START taking these medications   Details  oxyCODONE (ROXICODONE) 5 MG immediate release tablet Take 0.5 tablets (2.5 mg total) by mouth every 4 (four) hours as needed for severe pain., Starting 10/27/2015, Until Discontinued, Print        The patient appears reasonably screen and/or stabilized for discharge and I doubt any other medical condition or other [facility] requiring further screening,  evaluation, or treatment in the ED at this time prior to discharge.      Melene Planan Jamila Slatten, DO 10/27/15 1746

## 2016-04-21 DIAGNOSIS — Z111 Encounter for screening for respiratory tuberculosis: Secondary | ICD-10-CM | POA: Diagnosis not present

## 2016-04-26 DIAGNOSIS — Z79899 Other long term (current) drug therapy: Secondary | ICD-10-CM | POA: Diagnosis not present

## 2016-04-26 DIAGNOSIS — Z23 Encounter for immunization: Secondary | ICD-10-CM | POA: Diagnosis not present

## 2016-04-26 DIAGNOSIS — R7303 Prediabetes: Secondary | ICD-10-CM | POA: Diagnosis not present

## 2016-04-26 DIAGNOSIS — I739 Peripheral vascular disease, unspecified: Secondary | ICD-10-CM | POA: Diagnosis not present

## 2016-04-26 DIAGNOSIS — I10 Essential (primary) hypertension: Secondary | ICD-10-CM | POA: Diagnosis not present

## 2016-04-26 DIAGNOSIS — F411 Generalized anxiety disorder: Secondary | ICD-10-CM | POA: Diagnosis not present

## 2016-04-26 DIAGNOSIS — I6529 Occlusion and stenosis of unspecified carotid artery: Secondary | ICD-10-CM | POA: Diagnosis not present

## 2016-04-26 DIAGNOSIS — K219 Gastro-esophageal reflux disease without esophagitis: Secondary | ICD-10-CM | POA: Diagnosis not present

## 2016-04-26 DIAGNOSIS — E784 Other hyperlipidemia: Secondary | ICD-10-CM | POA: Diagnosis not present

## 2016-04-26 DIAGNOSIS — E039 Hypothyroidism, unspecified: Secondary | ICD-10-CM | POA: Diagnosis not present

## 2016-04-26 DIAGNOSIS — M179 Osteoarthritis of knee, unspecified: Secondary | ICD-10-CM | POA: Diagnosis not present

## 2016-04-26 DIAGNOSIS — Z720 Tobacco use: Secondary | ICD-10-CM | POA: Diagnosis not present

## 2016-05-20 ENCOUNTER — Other Ambulatory Visit: Payer: Self-pay | Admitting: Family

## 2016-05-20 DIAGNOSIS — I6529 Occlusion and stenosis of unspecified carotid artery: Secondary | ICD-10-CM

## 2016-05-21 ENCOUNTER — Inpatient Hospital Stay: Admission: RE | Admit: 2016-05-21 | Payer: BC Managed Care – PPO | Source: Ambulatory Visit

## 2016-05-25 ENCOUNTER — Ambulatory Visit
Admission: RE | Admit: 2016-05-25 | Discharge: 2016-05-25 | Disposition: A | Discharge status: Home / Self Care | Payer: Self-pay | Source: Ambulatory Visit | Attending: Family | Admitting: Family

## 2016-05-25 DIAGNOSIS — I6529 Occlusion and stenosis of unspecified carotid artery: Secondary | ICD-10-CM

## 2016-05-31 DIAGNOSIS — F411 Generalized anxiety disorder: Secondary | ICD-10-CM | POA: Diagnosis not present

## 2016-05-31 DIAGNOSIS — Z23 Encounter for immunization: Secondary | ICD-10-CM | POA: Diagnosis not present

## 2016-11-19 DIAGNOSIS — E785 Hyperlipidemia, unspecified: Secondary | ICD-10-CM | POA: Diagnosis not present

## 2016-11-19 DIAGNOSIS — I509 Heart failure, unspecified: Secondary | ICD-10-CM | POA: Diagnosis not present

## 2016-11-19 DIAGNOSIS — E039 Hypothyroidism, unspecified: Secondary | ICD-10-CM | POA: Diagnosis not present

## 2016-11-19 DIAGNOSIS — Z720 Tobacco use: Secondary | ICD-10-CM | POA: Diagnosis not present

## 2016-11-19 DIAGNOSIS — I739 Peripheral vascular disease, unspecified: Secondary | ICD-10-CM | POA: Diagnosis not present

## 2016-11-19 DIAGNOSIS — K219 Gastro-esophageal reflux disease without esophagitis: Secondary | ICD-10-CM | POA: Diagnosis not present

## 2016-11-19 DIAGNOSIS — F411 Generalized anxiety disorder: Secondary | ICD-10-CM | POA: Diagnosis not present

## 2016-11-19 DIAGNOSIS — I10 Essential (primary) hypertension: Secondary | ICD-10-CM | POA: Diagnosis not present

## 2016-11-19 DIAGNOSIS — R7303 Prediabetes: Secondary | ICD-10-CM | POA: Diagnosis not present

## 2017-01-28 ENCOUNTER — Other Ambulatory Visit: Payer: Self-pay | Admitting: Nurse Practitioner

## 2017-01-28 DIAGNOSIS — F411 Generalized anxiety disorder: Secondary | ICD-10-CM | POA: Diagnosis not present

## 2017-01-28 DIAGNOSIS — E039 Hypothyroidism, unspecified: Secondary | ICD-10-CM | POA: Diagnosis not present

## 2017-01-28 DIAGNOSIS — I10 Essential (primary) hypertension: Secondary | ICD-10-CM | POA: Diagnosis not present

## 2017-01-28 DIAGNOSIS — K219 Gastro-esophageal reflux disease without esophagitis: Secondary | ICD-10-CM | POA: Diagnosis not present

## 2017-01-28 DIAGNOSIS — E785 Hyperlipidemia, unspecified: Secondary | ICD-10-CM | POA: Diagnosis not present

## 2017-01-28 DIAGNOSIS — R7303 Prediabetes: Secondary | ICD-10-CM | POA: Diagnosis not present

## 2017-01-28 DIAGNOSIS — I509 Heart failure, unspecified: Secondary | ICD-10-CM | POA: Diagnosis not present

## 2017-01-28 DIAGNOSIS — Z1231 Encounter for screening mammogram for malignant neoplasm of breast: Secondary | ICD-10-CM

## 2017-01-28 DIAGNOSIS — Z23 Encounter for immunization: Secondary | ICD-10-CM | POA: Diagnosis not present

## 2017-01-28 DIAGNOSIS — F17210 Nicotine dependence, cigarettes, uncomplicated: Secondary | ICD-10-CM | POA: Diagnosis not present

## 2017-01-28 DIAGNOSIS — Z0000 Encounter for general adult medical examination without abnormal findings: Secondary | ICD-10-CM | POA: Diagnosis not present

## 2017-02-07 ENCOUNTER — Ambulatory Visit: Payer: Self-pay

## 2017-02-12 ENCOUNTER — Encounter (HOSPITAL_COMMUNITY): Payer: Self-pay | Admitting: Family Medicine

## 2017-02-12 ENCOUNTER — Ambulatory Visit (HOSPITAL_COMMUNITY)
Admission: EM | Admit: 2017-02-12 | Discharge: 2017-02-12 | Disposition: A | Discharge status: Home / Self Care | Payer: Medicare Other | Attending: Family Medicine | Admitting: Family Medicine

## 2017-02-12 DIAGNOSIS — B9789 Other viral agents as the cause of diseases classified elsewhere: Secondary | ICD-10-CM

## 2017-02-12 DIAGNOSIS — J069 Acute upper respiratory infection, unspecified: Secondary | ICD-10-CM

## 2017-02-12 MED ORDER — HYDROCODONE-HOMATROPINE 5-1.5 MG/5ML PO SYRP: 5.0000 mL | ORAL_SOLUTION | Freq: Four times a day (QID) | ORAL | 0 refills | Status: DC | PRN

## 2017-02-12 NOTE — ED Provider Notes (Signed)
  [facility]   [id number] 02/12/17 Arrival Time: 1200  ASSESSMENT & PLAN:  1. Viral URI with cough     Meds ordered this encounter  Medications  . HYDROcodone-homatropine (HYCODAN) 5-1.5 MG/5ML syrup    Sig: Take 5 mLs by mouth every 6 (six) hours as needed for cough.    Dispense:  90 mL    Refill:  0   Medication sedation precautions given. OTC analgesics and symptom care as needed. May f/u as needed.  Reviewed expectations re: course of current medical issues. Questions answered. Outlined signs and symptoms indicating need for more acute intervention. Patient verbalized understanding. After Visit Summary given.   SUBJECTIVE:  Heidi Maddox is a 66 y.o. female who presents with complaint of nasal congestion, post-nasal drainage, and a persistent cough for the past 3-4 days. Abrupt onset. Body aches. No fevers reported. Aleve with mild help. Cough affecting sleep now. No SOB or wheezing. Non-smoker. Normal PO intake without n/v.  ROS: As per HPI.   OBJECTIVE:  Vitals:   02/12/17 1212  BP: 140/86  Pulse: 81  Resp: 18  Temp: 98.5 F (36.9 C)  SpO2: 99%     General appearance: alert; no distress HEENT: nasal congestion; clear runny nose; throat irritation secondary to post-nasal drainage Neck: supple without LAD Lungs: clear to auscultation bilaterally; active cough Skin: warm and dry Psychological: alert and cooperative; normal mood and affect  No Known Allergies  Past Medical History:  Diagnosis Date  . CHF (congestive heart failure) (HCC)   . Hypertension    Social History   Social History  . Marital status: Divorced    Spouse name: N/A  . Number of children: N/A  . Years of education: N/A   Occupational History  . Not on file.   Social History Main Topics  . Smoking status: Current Every Day Smoker  . Smokeless tobacco: Not on file  . Alcohol use No  . Drug use: Unknown  . Sexual activity: Not on file   Other Topics Concern  .  Not on file   Social History Narrative  . No narrative on file           Mardella Layman, MD 02/12/17 1224

## 2017-02-12 NOTE — ED Triage Notes (Signed)
Pt here for URI symptoms x 3 days. Taking aleve.

## 2017-03-21 DIAGNOSIS — R509 Fever, unspecified: Secondary | ICD-10-CM | POA: Diagnosis not present

## 2017-03-21 DIAGNOSIS — J0110 Acute frontal sinusitis, unspecified: Secondary | ICD-10-CM | POA: Diagnosis not present

## 2017-06-03 ENCOUNTER — Other Ambulatory Visit: Payer: Self-pay | Admitting: Internal Medicine

## 2017-06-03 DIAGNOSIS — Z139 Encounter for screening, unspecified: Secondary | ICD-10-CM

## 2017-06-23 ENCOUNTER — Ambulatory Visit
Admission: RE | Admit: 2017-06-23 | Discharge: 2017-06-23 | Disposition: A | Discharge status: Home / Self Care | Payer: Medicare Other | Source: Ambulatory Visit | Attending: Internal Medicine | Admitting: Internal Medicine

## 2017-06-23 DIAGNOSIS — Z139 Encounter for screening, unspecified: Secondary | ICD-10-CM

## 2018-02-21 IMAGING — US US CAROTID DUPLEX BILAT
1 series · 13 of 24 positions shown · non-contrast
Comparison: No prior duplex

CLINICAL DATA: 66-year-old female with a history of carotid
disease.

Cardiovascular risk factors include hypertension, hyperlipidemia,
tobacco use
EXAM:
BILATERAL CAROTID DUPLEX ULTRASOUND
TECHNIQUE: Gray scale imaging, color Doppler and duplex ultrasound were
performed of bilateral carotid and vertebral arteries in the neck.

[Series 1: us carotid duplex bilat · 0.07mm/px · 13 of 53 slices shown]
[im 1/53]
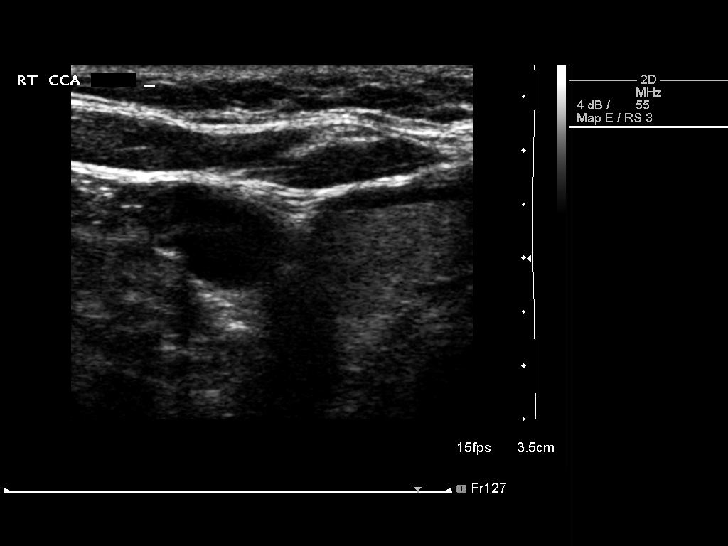
[im 5/53]
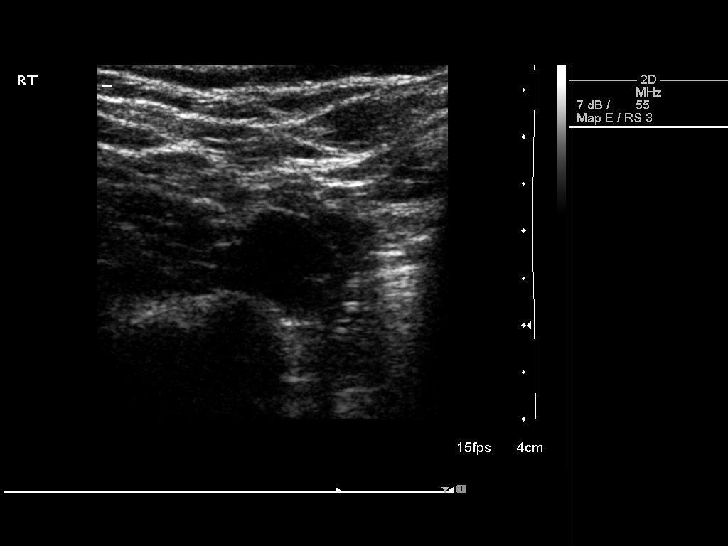
[im 10/53]
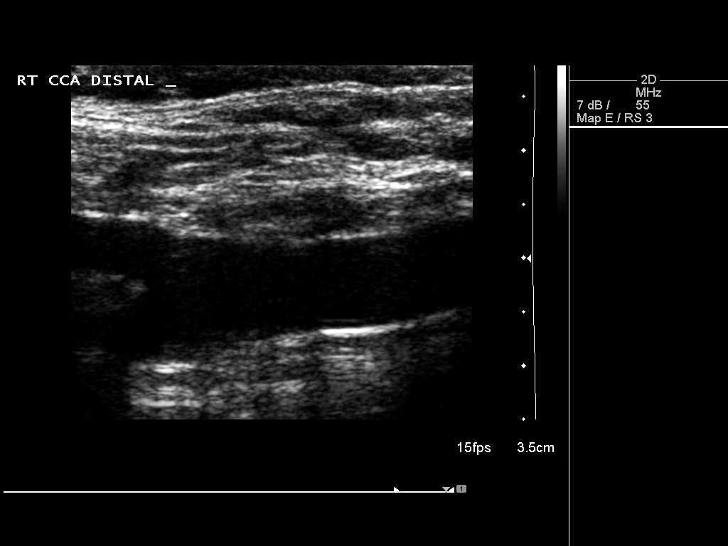
[im 14/53]
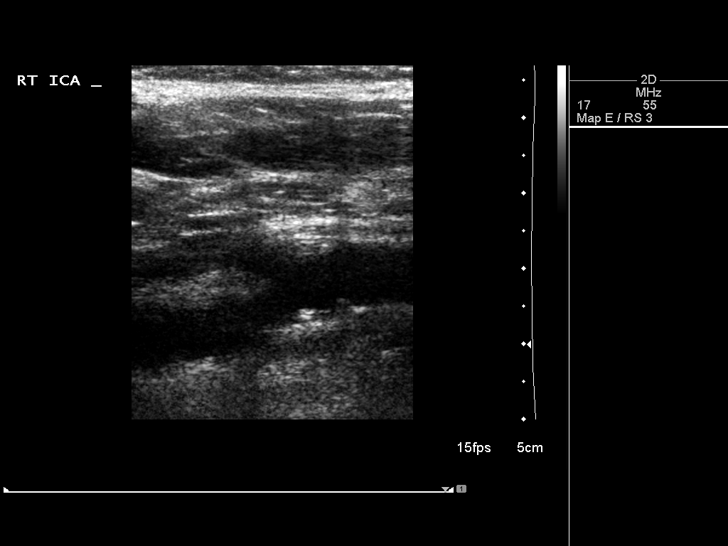
[im 19/53]
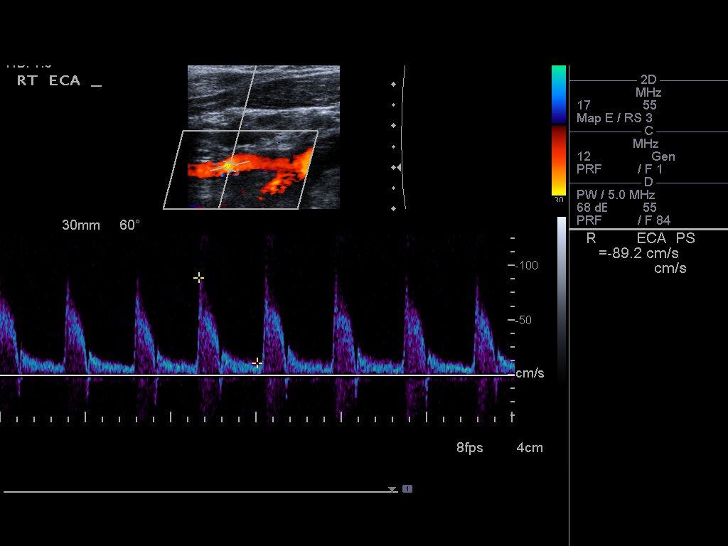
[im 23/53]
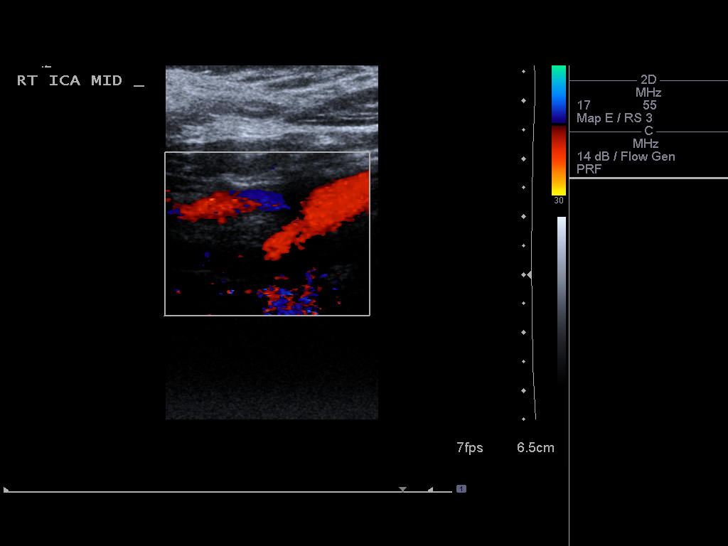
[im 28/53]
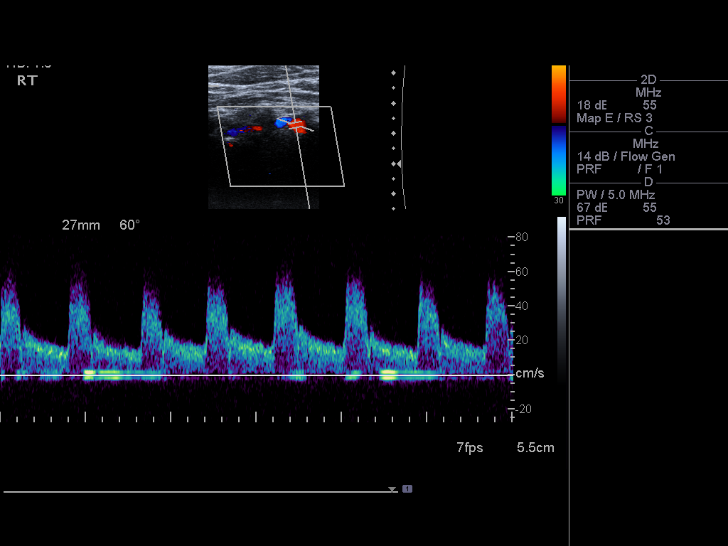
[im 30/53]
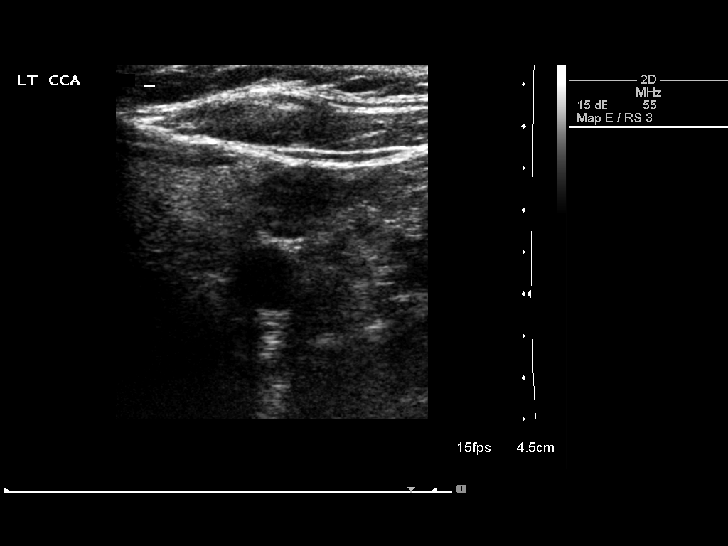
[im 34/53]
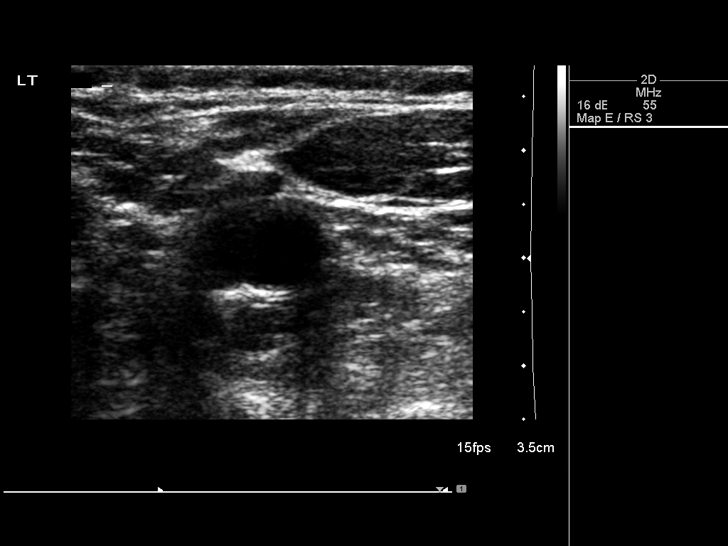
[im 39/53]
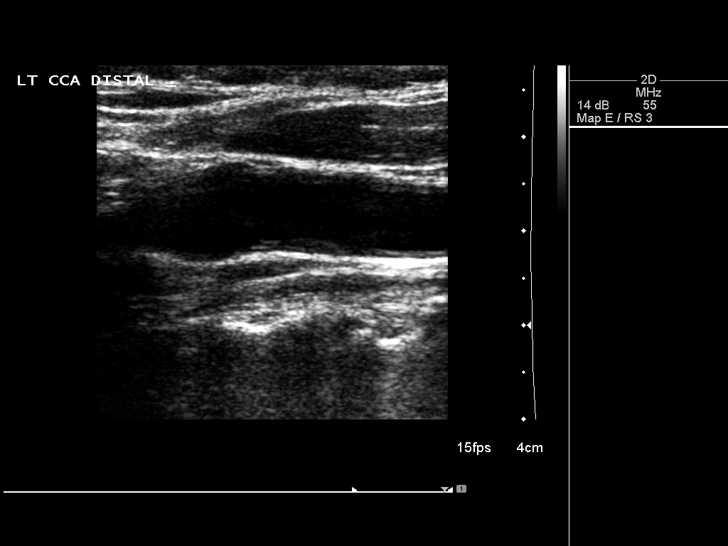
[im 43/53]
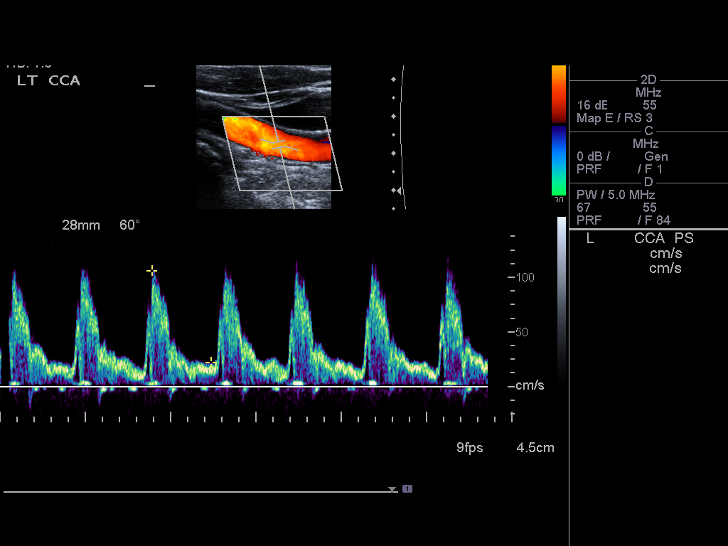
[im 48/53]
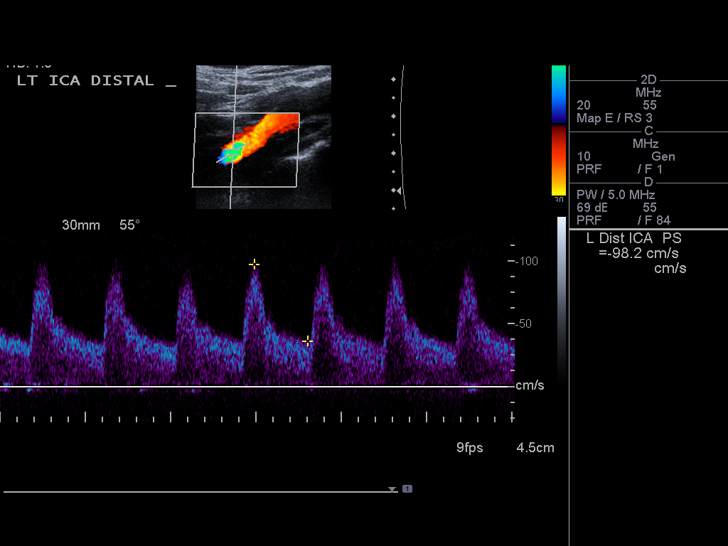
[im 53/53]
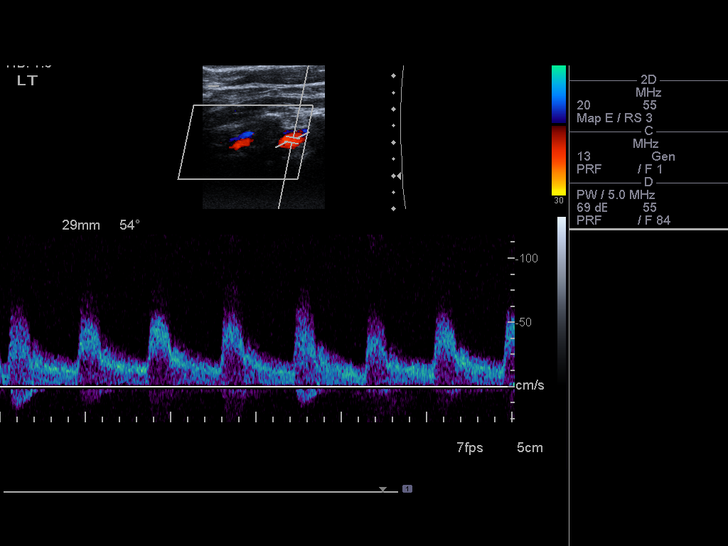

[13 of 24 positions shown; findings below may reference images not displayed]

FINDINGS: Criteria: Quantification of carotid stenosis is based on velocity
parameters that correlate the residual internal carotid diameter
with NASCET-based stenosis levels, using the diameter of the distal
internal carotid lumen as the denominator for stenosis measurement.

The following velocity measurements were obtained:

RIGHT

ICA:  Systolic 66 cm/sec, Diastolic 27 cm/sec

CCA:  85 cm/sec

SYSTOLIC ICA/CCA RATIO:

ECA:  89 cm/sec

LEFT

ICA:  Systolic 98 cm/sec, Diastolic 37 cm/sec

CCA:  107 cm/sec

SYSTOLIC ICA/CCA RATIO:

ECA:  86 cm/sec

Right Brachial SBP: 148

Left Brachial SBP: 138

RIGHT CAROTID ARTERY: No significant calcified disease of the right
common carotid artery. Intermediate waveform maintained.
Heterogeneous plaque without significant calcifications at the right
carotid bifurcation. Low resistance waveform of the right ICA. No
significant tortuosity.

RIGHT VERTEBRAL ARTERY: Antegrade flow with low resistance waveform.

LEFT CAROTID ARTERY: No significant calcified disease of the left
common carotid artery. Intermediate waveform maintained.
Heterogeneous plaque at the left carotid bifurcation without
significant calcifications. Low resistance waveform of the left ICA.

LEFT VERTEBRAL ARTERY:  Antegrade flow with low resistance waveform.
IMPRESSION: Color duplex indicates minimal heterogeneous plaque, with no
hemodynamically significant stenosis by duplex criteria in the
extracranial cerebrovascular circulation.

## 2019-02-13 ENCOUNTER — Other Ambulatory Visit: Payer: Self-pay | Admitting: Internal Medicine

## 2019-02-13 DIAGNOSIS — Z1231 Encounter for screening mammogram for malignant neoplasm of breast: Secondary | ICD-10-CM

## 2019-04-03 ENCOUNTER — Ambulatory Visit
Admission: RE | Admit: 2019-04-03 | Discharge: 2019-04-03 | Disposition: A | Discharge status: Home / Self Care | Payer: Medicare Other | Source: Ambulatory Visit | Attending: Internal Medicine | Admitting: Internal Medicine

## 2019-04-03 ENCOUNTER — Other Ambulatory Visit: Payer: Self-pay

## 2019-04-03 DIAGNOSIS — Z1231 Encounter for screening mammogram for malignant neoplasm of breast: Secondary | ICD-10-CM

## 2019-09-07 DIAGNOSIS — E1169 Type 2 diabetes mellitus with other specified complication: Secondary | ICD-10-CM | POA: Diagnosis not present

## 2019-09-07 DIAGNOSIS — M179 Osteoarthritis of knee, unspecified: Secondary | ICD-10-CM | POA: Diagnosis not present

## 2019-09-07 DIAGNOSIS — I10 Essential (primary) hypertension: Secondary | ICD-10-CM | POA: Diagnosis not present

## 2019-09-07 DIAGNOSIS — E785 Hyperlipidemia, unspecified: Secondary | ICD-10-CM | POA: Diagnosis not present

## 2019-09-07 DIAGNOSIS — M170 Bilateral primary osteoarthritis of knee: Secondary | ICD-10-CM | POA: Diagnosis not present

## 2019-09-07 DIAGNOSIS — I509 Heart failure, unspecified: Secondary | ICD-10-CM | POA: Diagnosis not present

## 2019-09-07 DIAGNOSIS — E039 Hypothyroidism, unspecified: Secondary | ICD-10-CM | POA: Diagnosis not present

## 2019-10-01 DIAGNOSIS — K2900 Acute gastritis without bleeding: Secondary | ICD-10-CM | POA: Diagnosis not present

## 2019-10-01 DIAGNOSIS — K5901 Slow transit constipation: Secondary | ICD-10-CM | POA: Diagnosis not present

## 2019-10-01 DIAGNOSIS — J019 Acute sinusitis, unspecified: Secondary | ICD-10-CM | POA: Diagnosis not present

## 2019-10-04 DIAGNOSIS — Z1159 Encounter for screening for other viral diseases: Secondary | ICD-10-CM | POA: Diagnosis not present

## 2019-10-05 DIAGNOSIS — E039 Hypothyroidism, unspecified: Secondary | ICD-10-CM | POA: Diagnosis not present

## 2019-10-05 DIAGNOSIS — I10 Essential (primary) hypertension: Secondary | ICD-10-CM | POA: Diagnosis not present

## 2019-10-05 DIAGNOSIS — E785 Hyperlipidemia, unspecified: Secondary | ICD-10-CM | POA: Diagnosis not present

## 2019-10-05 DIAGNOSIS — I509 Heart failure, unspecified: Secondary | ICD-10-CM | POA: Diagnosis not present

## 2019-10-05 DIAGNOSIS — M179 Osteoarthritis of knee, unspecified: Secondary | ICD-10-CM | POA: Diagnosis not present

## 2019-10-05 DIAGNOSIS — M170 Bilateral primary osteoarthritis of knee: Secondary | ICD-10-CM | POA: Diagnosis not present

## 2019-10-05 DIAGNOSIS — E1169 Type 2 diabetes mellitus with other specified complication: Secondary | ICD-10-CM | POA: Diagnosis not present

## 2019-10-09 DIAGNOSIS — B9681 Helicobacter pylori [H. pylori] as the cause of diseases classified elsewhere: Secondary | ICD-10-CM | POA: Diagnosis not present

## 2019-10-09 DIAGNOSIS — K449 Diaphragmatic hernia without obstruction or gangrene: Secondary | ICD-10-CM | POA: Diagnosis not present

## 2019-10-09 DIAGNOSIS — Z1211 Encounter for screening for malignant neoplasm of colon: Secondary | ICD-10-CM | POA: Diagnosis not present

## 2019-10-09 DIAGNOSIS — R12 Heartburn: Secondary | ICD-10-CM | POA: Diagnosis not present

## 2019-10-09 DIAGNOSIS — K222 Esophageal obstruction: Secondary | ICD-10-CM | POA: Diagnosis not present

## 2019-10-09 DIAGNOSIS — K648 Other hemorrhoids: Secondary | ICD-10-CM | POA: Diagnosis not present

## 2019-10-09 DIAGNOSIS — K2930 Chronic superficial gastritis without bleeding: Secondary | ICD-10-CM | POA: Diagnosis not present

## 2019-10-15 DIAGNOSIS — K2930 Chronic superficial gastritis without bleeding: Secondary | ICD-10-CM | POA: Diagnosis not present

## 2019-10-15 DIAGNOSIS — B9681 Helicobacter pylori [H. pylori] as the cause of diseases classified elsewhere: Secondary | ICD-10-CM | POA: Diagnosis not present

## 2019-11-07 DIAGNOSIS — I10 Essential (primary) hypertension: Secondary | ICD-10-CM | POA: Diagnosis not present

## 2019-11-07 DIAGNOSIS — I509 Heart failure, unspecified: Secondary | ICD-10-CM | POA: Diagnosis not present

## 2019-11-07 DIAGNOSIS — E039 Hypothyroidism, unspecified: Secondary | ICD-10-CM | POA: Diagnosis not present

## 2019-11-07 DIAGNOSIS — E1169 Type 2 diabetes mellitus with other specified complication: Secondary | ICD-10-CM | POA: Diagnosis not present

## 2019-11-07 DIAGNOSIS — M170 Bilateral primary osteoarthritis of knee: Secondary | ICD-10-CM | POA: Diagnosis not present

## 2019-11-07 DIAGNOSIS — M179 Osteoarthritis of knee, unspecified: Secondary | ICD-10-CM | POA: Diagnosis not present

## 2019-11-07 DIAGNOSIS — E785 Hyperlipidemia, unspecified: Secondary | ICD-10-CM | POA: Diagnosis not present

## 2019-12-14 DIAGNOSIS — E1169 Type 2 diabetes mellitus with other specified complication: Secondary | ICD-10-CM | POA: Diagnosis not present

## 2019-12-14 DIAGNOSIS — I509 Heart failure, unspecified: Secondary | ICD-10-CM | POA: Diagnosis not present

## 2019-12-14 DIAGNOSIS — E785 Hyperlipidemia, unspecified: Secondary | ICD-10-CM | POA: Diagnosis not present

## 2019-12-14 DIAGNOSIS — E039 Hypothyroidism, unspecified: Secondary | ICD-10-CM | POA: Diagnosis not present

## 2019-12-14 DIAGNOSIS — I10 Essential (primary) hypertension: Secondary | ICD-10-CM | POA: Diagnosis not present

## 2019-12-14 DIAGNOSIS — M179 Osteoarthritis of knee, unspecified: Secondary | ICD-10-CM | POA: Diagnosis not present

## 2019-12-14 DIAGNOSIS — M170 Bilateral primary osteoarthritis of knee: Secondary | ICD-10-CM | POA: Diagnosis not present

## 2020-01-10 ENCOUNTER — Other Ambulatory Visit: Payer: Self-pay

## 2020-01-10 ENCOUNTER — Encounter (HOSPITAL_COMMUNITY): Payer: Self-pay

## 2020-01-10 ENCOUNTER — Ambulatory Visit (HOSPITAL_COMMUNITY)
Admission: EM | Admit: 2020-01-10 | Discharge: 2020-01-10 | Disposition: A | Discharge status: Home / Self Care | Payer: Medicare PPO | Attending: Physician Assistant | Admitting: Physician Assistant

## 2020-01-10 DIAGNOSIS — H1032 Unspecified acute conjunctivitis, left eye: Secondary | ICD-10-CM

## 2020-01-10 MED ORDER — BACITRACIN-POLYMYXIN B 500-10000 UNIT/GM OP OINT: 1.0000 "application " | TOPICAL_OINTMENT | Freq: Four times a day (QID) | OPHTHALMIC | 0 refills | Status: AC

## 2020-01-10 NOTE — ED Provider Notes (Signed)
[facility]    CSN: [id number] Arrival date & time: 01/10/20  1635      History   Chief Complaint Chief Complaint  Patient presents with   Eye Problem    HPI Heidi Maddox is a 69 y.o. female.   Presents with irritated left eye. She notes yellow drainage from the left eye x 2 days. She tried OTC drops without relief. No fever or chills. No cough or congestion. No known exposures.      Past Medical History:  Diagnosis Date   CHF (congestive heart failure) (HCC)    Hypertension     There are no problems to display for this patient.   History reviewed. No pertinent surgical history.  OB History   No obstetric history on file.      Home Medications    Prior to Admission medications   Medication Sig Start Date End Date Taking? Authorizing Provider  aspirin EC 81 MG tablet Take 81 mg by mouth daily.    [provider]  bacitracin-polymyxin b (POLYSPORIN) ophthalmic ointment Place 1-2 application into the left eye 4 (four) times daily for 7 days. apply to eye every 6 hours for 5-7 days 01/10/20 01/17/20  Riki Sheer, PA-C  cholecalciferol (VITAMIN D) 1000 units tablet Take 1,000 Units by mouth daily.    [provider]  furosemide (LASIX) 20 MG tablet Take 20 mg by mouth daily as needed for fluid or edema.  09/17/15   [provider]  HYDROcodone-homatropine (HYCODAN) 5-1.5 MG/5ML syrup Take 5 mLs by mouth every 6 (six) hours as needed for cough. 02/12/17   Mardella Layman, MD  levothyroxine (SYNTHROID, LEVOTHROID) 25 MCG tablet Take 25 mcg by mouth daily. 10/06/15   [provider]  Multiple Vitamins-Minerals (MULTIVITAMIN ADULT PO) Take 1 tablet by mouth daily.    [provider]  Omega-3 Fatty Acids (FISH OIL) 1000 MG CAPS Take 1,000 mg by mouth daily.    [provider]  oxyCODONE (ROXICODONE) 5 MG immediate release tablet Take 0.5 tablets (2.5 mg total) by mouth every 4 (four) hours as needed for  severe pain. 10/27/15   Melene Plan, DO  rosuvastatin (CRESTOR) 40 MG tablet Take 40 mg by mouth daily. 09/18/15   [provider]  sertraline (ZOLOFT) 100 MG tablet Take 100 mg by mouth daily. 10/16/15   [provider]  traMADol (ULTRAM) 50 MG tablet Take 50-100 mg by mouth every 8 (eight) hours as needed for moderate pain or severe pain.  09/02/15   [provider]  TRIBENZOR 40-10-25 MG TABS Take 1 tablet by mouth daily. 10/06/15   [provider]    Family History History reviewed. No pertinent family history.  Social History Social History   Tobacco Use   Smoking status: Current Every Day Smoker   Smokeless tobacco: Never Used  Substance Use Topics   Alcohol use: No   Drug use: Not on file     Allergies   Patient has no known allergies.   Review of Systems Review of Systems  Constitutional: Negative for fever.  Eyes: Positive for discharge and itching. Negative for pain and redness.  Skin: Negative.   All other systems reviewed and are negative.    Physical Exam Triage Vital Signs ED Triage Vitals  Enc Vitals Group     BP 01/10/20 1855 (!) 152/74     Pulse Rate 01/10/20 1855 76     Resp 01/10/20 1855 20     Temp  01/10/20 1855 98.2 F (36.8 C)     Temp Source 01/10/20 1855 Oral     SpO2 01/10/20 1855 99 %     Weight --      Height --      Head Circumference --      Peak Flow --      Pain Score 01/10/20 1853 9     Pain Loc --      Pain Edu? --      Excl. in GC? --    No data found.  Updated Vital Signs BP (!) 152/74 (BP Location: Right Wrist)    Pulse 76    Temp 98.2 F (36.8 C) (Oral)    Resp 20    SpO2 99%   Visual Acuity Right Eye Distance:   Left Eye Distance:   Bilateral Distance:    Right Eye Near:   Left Eye Near:    Bilateral Near:     Physical Exam Vitals and nursing note reviewed.  Constitutional:      Appearance: Normal appearance.  Eyes:     General:        Right eye: No discharge.         Left eye: Discharge present.    Pupils: Pupils are equal, round, and reactive to light.     Comments: Left beefy red conjunctiva with yellow/green discharge  Cardiovascular:     Rate and Rhythm: Normal rate.  Pulmonary:     Effort: Pulmonary effort is normal.  Neurological:     Mental Status: She is alert.      UC Treatments / Results  Labs (all labs ordered are listed, but only abnormal results are displayed) Labs Reviewed - No data to display  EKG   Radiology No results found.  Procedures Procedures (including critical care time)  Medications Ordered in UC Medications - No data to display  Initial Impression / Assessment and Plan / UC Course  I have reviewed the triage vital signs and the nursing notes.  Pertinent labs & imaging results that were available during my care of the patient were reviewed by me and considered in my medical decision making (see chart for details).     Treat with polymycin and warm compresses. FU as needed.  Final Clinical Impressions(s) / UC Diagnoses   Final diagnoses:  Acute bacterial conjunctivitis of left eye     Discharge Instructions     You have a bacterial infection. Treat with drops every 6 hours for up to 7 days as we discussed. Keep clean with warm compresses. FU if needed.    ED Prescriptions    Medication Sig Dispense Auth. Provider   bacitracin-polymyxin b (POLYSPORIN) ophthalmic ointment Place 1-2 application into the left eye 4 (four) times daily for 7 days. apply to eye every 6 hours for 5-7 days 19.6 g Riki Sheer, [place]     PDMP not reviewed this encounter.   Riki Sheer, [place] 01/10/20 1929

## 2020-01-10 NOTE — Discharge Instructions (Addendum)
You have a bacterial infection. Treat with drops every 6 hours for up to 7 days as we discussed. Keep clean with warm compresses. FU if needed.

## 2020-01-10 NOTE — ED Triage Notes (Signed)
Pt reports bilateral eye pain and discharge x 4 days. Pt used OTC eye drops without relief.

## 2020-01-16 DIAGNOSIS — K5901 Slow transit constipation: Secondary | ICD-10-CM | POA: Diagnosis not present

## 2020-01-16 DIAGNOSIS — H01002 Unspecified blepharitis right lower eyelid: Secondary | ICD-10-CM | POA: Diagnosis not present

## 2020-01-16 DIAGNOSIS — H01005 Unspecified blepharitis left lower eyelid: Secondary | ICD-10-CM | POA: Diagnosis not present

## 2020-01-16 DIAGNOSIS — I10 Essential (primary) hypertension: Secondary | ICD-10-CM | POA: Diagnosis not present

## 2020-01-16 DIAGNOSIS — J301 Allergic rhinitis due to pollen: Secondary | ICD-10-CM | POA: Diagnosis not present

## 2020-01-16 DIAGNOSIS — E1169 Type 2 diabetes mellitus with other specified complication: Secondary | ICD-10-CM | POA: Diagnosis not present

## 2020-01-18 DIAGNOSIS — M179 Osteoarthritis of knee, unspecified: Secondary | ICD-10-CM | POA: Diagnosis not present

## 2020-01-18 DIAGNOSIS — E1169 Type 2 diabetes mellitus with other specified complication: Secondary | ICD-10-CM | POA: Diagnosis not present

## 2020-01-18 DIAGNOSIS — M170 Bilateral primary osteoarthritis of knee: Secondary | ICD-10-CM | POA: Diagnosis not present

## 2020-01-18 DIAGNOSIS — I10 Essential (primary) hypertension: Secondary | ICD-10-CM | POA: Diagnosis not present

## 2020-01-18 DIAGNOSIS — E039 Hypothyroidism, unspecified: Secondary | ICD-10-CM | POA: Diagnosis not present

## 2020-01-18 DIAGNOSIS — I509 Heart failure, unspecified: Secondary | ICD-10-CM | POA: Diagnosis not present

## 2020-01-18 DIAGNOSIS — E785 Hyperlipidemia, unspecified: Secondary | ICD-10-CM | POA: Diagnosis not present

## 2020-01-23 DIAGNOSIS — I509 Heart failure, unspecified: Secondary | ICD-10-CM | POA: Diagnosis not present

## 2020-01-23 DIAGNOSIS — M179 Osteoarthritis of knee, unspecified: Secondary | ICD-10-CM | POA: Diagnosis not present

## 2020-01-23 DIAGNOSIS — M170 Bilateral primary osteoarthritis of knee: Secondary | ICD-10-CM | POA: Diagnosis not present

## 2020-01-23 DIAGNOSIS — E7841 Elevated Lipoprotein(a): Secondary | ICD-10-CM | POA: Diagnosis not present

## 2020-01-23 DIAGNOSIS — E1169 Type 2 diabetes mellitus with other specified complication: Secondary | ICD-10-CM | POA: Diagnosis not present

## 2020-01-23 DIAGNOSIS — E785 Hyperlipidemia, unspecified: Secondary | ICD-10-CM | POA: Diagnosis not present

## 2020-01-23 DIAGNOSIS — I10 Essential (primary) hypertension: Secondary | ICD-10-CM | POA: Diagnosis not present

## 2020-01-23 DIAGNOSIS — E039 Hypothyroidism, unspecified: Secondary | ICD-10-CM | POA: Diagnosis not present

## 2020-03-27 DIAGNOSIS — M170 Bilateral primary osteoarthritis of knee: Secondary | ICD-10-CM | POA: Diagnosis not present

## 2020-03-27 DIAGNOSIS — E039 Hypothyroidism, unspecified: Secondary | ICD-10-CM | POA: Diagnosis not present

## 2020-03-27 DIAGNOSIS — K219 Gastro-esophageal reflux disease without esophagitis: Secondary | ICD-10-CM | POA: Diagnosis not present

## 2020-03-27 DIAGNOSIS — I10 Essential (primary) hypertension: Secondary | ICD-10-CM | POA: Diagnosis not present

## 2020-03-27 DIAGNOSIS — E7849 Other hyperlipidemia: Secondary | ICD-10-CM | POA: Diagnosis not present

## 2020-03-27 DIAGNOSIS — E1169 Type 2 diabetes mellitus with other specified complication: Secondary | ICD-10-CM | POA: Diagnosis not present

## 2020-03-27 DIAGNOSIS — I509 Heart failure, unspecified: Secondary | ICD-10-CM | POA: Diagnosis not present

## 2020-03-27 DIAGNOSIS — E785 Hyperlipidemia, unspecified: Secondary | ICD-10-CM | POA: Diagnosis not present

## 2020-03-27 DIAGNOSIS — M179 Osteoarthritis of knee, unspecified: Secondary | ICD-10-CM | POA: Diagnosis not present

## 2020-04-08 DIAGNOSIS — J301 Allergic rhinitis due to pollen: Secondary | ICD-10-CM | POA: Diagnosis not present

## 2020-04-08 DIAGNOSIS — I509 Heart failure, unspecified: Secondary | ICD-10-CM | POA: Diagnosis not present

## 2020-04-08 DIAGNOSIS — E039 Hypothyroidism, unspecified: Secondary | ICD-10-CM | POA: Diagnosis not present

## 2020-04-08 DIAGNOSIS — Z0000 Encounter for general adult medical examination without abnormal findings: Secondary | ICD-10-CM | POA: Diagnosis not present

## 2020-04-08 DIAGNOSIS — Z23 Encounter for immunization: Secondary | ICD-10-CM | POA: Diagnosis not present

## 2020-04-08 DIAGNOSIS — E785 Hyperlipidemia, unspecified: Secondary | ICD-10-CM | POA: Diagnosis not present

## 2020-04-08 DIAGNOSIS — E1169 Type 2 diabetes mellitus with other specified complication: Secondary | ICD-10-CM | POA: Diagnosis not present

## 2020-04-08 DIAGNOSIS — I10 Essential (primary) hypertension: Secondary | ICD-10-CM | POA: Diagnosis not present

## 2020-04-08 DIAGNOSIS — F411 Generalized anxiety disorder: Secondary | ICD-10-CM | POA: Diagnosis not present

## 2020-04-09 DIAGNOSIS — Z7984 Long term (current) use of oral hypoglycemic drugs: Secondary | ICD-10-CM | POA: Diagnosis not present

## 2020-04-09 DIAGNOSIS — E1169 Type 2 diabetes mellitus with other specified complication: Secondary | ICD-10-CM | POA: Diagnosis not present

## 2020-05-07 DIAGNOSIS — I509 Heart failure, unspecified: Secondary | ICD-10-CM | POA: Diagnosis not present

## 2020-05-07 DIAGNOSIS — E7849 Other hyperlipidemia: Secondary | ICD-10-CM | POA: Diagnosis not present

## 2020-05-07 DIAGNOSIS — E785 Hyperlipidemia, unspecified: Secondary | ICD-10-CM | POA: Diagnosis not present

## 2020-05-07 DIAGNOSIS — M170 Bilateral primary osteoarthritis of knee: Secondary | ICD-10-CM | POA: Diagnosis not present

## 2020-05-07 DIAGNOSIS — M179 Osteoarthritis of knee, unspecified: Secondary | ICD-10-CM | POA: Diagnosis not present

## 2020-05-07 DIAGNOSIS — E039 Hypothyroidism, unspecified: Secondary | ICD-10-CM | POA: Diagnosis not present

## 2020-05-07 DIAGNOSIS — E1169 Type 2 diabetes mellitus with other specified complication: Secondary | ICD-10-CM | POA: Diagnosis not present

## 2020-05-07 DIAGNOSIS — K219 Gastro-esophageal reflux disease without esophagitis: Secondary | ICD-10-CM | POA: Diagnosis not present

## 2020-05-07 DIAGNOSIS — I10 Essential (primary) hypertension: Secondary | ICD-10-CM | POA: Diagnosis not present

## 2020-05-11 DIAGNOSIS — Z20822 Contact with and (suspected) exposure to covid-19: Secondary | ICD-10-CM | POA: Diagnosis not present

## 2020-06-16 DIAGNOSIS — I10 Essential (primary) hypertension: Secondary | ICD-10-CM | POA: Diagnosis not present

## 2020-06-16 DIAGNOSIS — F411 Generalized anxiety disorder: Secondary | ICD-10-CM | POA: Diagnosis not present

## 2020-06-16 DIAGNOSIS — Z720 Tobacco use: Secondary | ICD-10-CM | POA: Diagnosis not present

## 2020-07-07 DIAGNOSIS — K219 Gastro-esophageal reflux disease without esophagitis: Secondary | ICD-10-CM | POA: Diagnosis not present

## 2020-07-07 DIAGNOSIS — I10 Essential (primary) hypertension: Secondary | ICD-10-CM | POA: Diagnosis not present

## 2020-07-07 DIAGNOSIS — I509 Heart failure, unspecified: Secondary | ICD-10-CM | POA: Diagnosis not present

## 2020-07-07 DIAGNOSIS — E1169 Type 2 diabetes mellitus with other specified complication: Secondary | ICD-10-CM | POA: Diagnosis not present

## 2020-07-07 DIAGNOSIS — M179 Osteoarthritis of knee, unspecified: Secondary | ICD-10-CM | POA: Diagnosis not present

## 2020-07-07 DIAGNOSIS — E785 Hyperlipidemia, unspecified: Secondary | ICD-10-CM | POA: Diagnosis not present

## 2020-07-07 DIAGNOSIS — M170 Bilateral primary osteoarthritis of knee: Secondary | ICD-10-CM | POA: Diagnosis not present

## 2020-07-07 DIAGNOSIS — E039 Hypothyroidism, unspecified: Secondary | ICD-10-CM | POA: Diagnosis not present

## 2020-07-08 DIAGNOSIS — R600 Localized edema: Secondary | ICD-10-CM | POA: Diagnosis not present

## 2020-07-08 DIAGNOSIS — F411 Generalized anxiety disorder: Secondary | ICD-10-CM | POA: Diagnosis not present

## 2020-07-08 DIAGNOSIS — Z1231 Encounter for screening mammogram for malignant neoplasm of breast: Secondary | ICD-10-CM | POA: Diagnosis not present

## 2020-07-08 DIAGNOSIS — Z720 Tobacco use: Secondary | ICD-10-CM | POA: Diagnosis not present

## 2020-07-08 DIAGNOSIS — I872 Venous insufficiency (chronic) (peripheral): Secondary | ICD-10-CM | POA: Diagnosis not present

## 2020-07-10 ENCOUNTER — Other Ambulatory Visit: Payer: Self-pay | Admitting: Internal Medicine

## 2020-07-10 DIAGNOSIS — Z1231 Encounter for screening mammogram for malignant neoplasm of breast: Secondary | ICD-10-CM

## 2020-08-15 DIAGNOSIS — M170 Bilateral primary osteoarthritis of knee: Secondary | ICD-10-CM | POA: Diagnosis not present

## 2020-08-15 DIAGNOSIS — E785 Hyperlipidemia, unspecified: Secondary | ICD-10-CM | POA: Diagnosis not present

## 2020-08-15 DIAGNOSIS — M179 Osteoarthritis of knee, unspecified: Secondary | ICD-10-CM | POA: Diagnosis not present

## 2020-08-15 DIAGNOSIS — I509 Heart failure, unspecified: Secondary | ICD-10-CM | POA: Diagnosis not present

## 2020-08-15 DIAGNOSIS — K219 Gastro-esophageal reflux disease without esophagitis: Secondary | ICD-10-CM | POA: Diagnosis not present

## 2020-08-15 DIAGNOSIS — I10 Essential (primary) hypertension: Secondary | ICD-10-CM | POA: Diagnosis not present

## 2020-08-15 DIAGNOSIS — E039 Hypothyroidism, unspecified: Secondary | ICD-10-CM | POA: Diagnosis not present

## 2020-08-15 DIAGNOSIS — E1169 Type 2 diabetes mellitus with other specified complication: Secondary | ICD-10-CM | POA: Diagnosis not present

## 2020-08-27 DIAGNOSIS — I509 Heart failure, unspecified: Secondary | ICD-10-CM | POA: Diagnosis not present

## 2020-08-27 DIAGNOSIS — E039 Hypothyroidism, unspecified: Secondary | ICD-10-CM | POA: Diagnosis not present

## 2020-08-27 DIAGNOSIS — M170 Bilateral primary osteoarthritis of knee: Secondary | ICD-10-CM | POA: Diagnosis not present

## 2020-08-27 DIAGNOSIS — E785 Hyperlipidemia, unspecified: Secondary | ICD-10-CM | POA: Diagnosis not present

## 2020-08-27 DIAGNOSIS — E1169 Type 2 diabetes mellitus with other specified complication: Secondary | ICD-10-CM | POA: Diagnosis not present

## 2020-08-27 DIAGNOSIS — I10 Essential (primary) hypertension: Secondary | ICD-10-CM | POA: Diagnosis not present

## 2020-08-27 DIAGNOSIS — K219 Gastro-esophageal reflux disease without esophagitis: Secondary | ICD-10-CM | POA: Diagnosis not present

## 2020-08-29 ENCOUNTER — Other Ambulatory Visit: Payer: Self-pay

## 2020-08-29 ENCOUNTER — Ambulatory Visit
Admission: RE | Admit: 2020-08-29 | Discharge: 2020-08-29 | Disposition: A | Discharge status: Home / Self Care | Payer: Medicare PPO | Source: Ambulatory Visit | Attending: Internal Medicine | Admitting: Internal Medicine

## 2020-08-29 DIAGNOSIS — Z1231 Encounter for screening mammogram for malignant neoplasm of breast: Secondary | ICD-10-CM | POA: Diagnosis not present

## 2020-10-08 DIAGNOSIS — E1169 Type 2 diabetes mellitus with other specified complication: Secondary | ICD-10-CM | POA: Diagnosis not present

## 2020-10-08 DIAGNOSIS — I6529 Occlusion and stenosis of unspecified carotid artery: Secondary | ICD-10-CM | POA: Diagnosis not present

## 2020-10-08 DIAGNOSIS — Z7984 Long term (current) use of oral hypoglycemic drugs: Secondary | ICD-10-CM | POA: Diagnosis not present

## 2020-10-08 DIAGNOSIS — N289 Disorder of kidney and ureter, unspecified: Secondary | ICD-10-CM | POA: Diagnosis not present

## 2020-10-08 DIAGNOSIS — I10 Essential (primary) hypertension: Secondary | ICD-10-CM | POA: Diagnosis not present

## 2020-10-08 DIAGNOSIS — E785 Hyperlipidemia, unspecified: Secondary | ICD-10-CM | POA: Diagnosis not present

## 2020-10-08 DIAGNOSIS — I739 Peripheral vascular disease, unspecified: Secondary | ICD-10-CM | POA: Diagnosis not present

## 2020-10-08 DIAGNOSIS — Z720 Tobacco use: Secondary | ICD-10-CM | POA: Diagnosis not present

## 2020-10-16 DIAGNOSIS — I10 Essential (primary) hypertension: Secondary | ICD-10-CM | POA: Diagnosis not present

## 2020-10-16 DIAGNOSIS — E1169 Type 2 diabetes mellitus with other specified complication: Secondary | ICD-10-CM | POA: Diagnosis not present

## 2020-10-16 DIAGNOSIS — E785 Hyperlipidemia, unspecified: Secondary | ICD-10-CM | POA: Diagnosis not present

## 2020-10-16 DIAGNOSIS — K219 Gastro-esophageal reflux disease without esophagitis: Secondary | ICD-10-CM | POA: Diagnosis not present

## 2020-10-16 DIAGNOSIS — M170 Bilateral primary osteoarthritis of knee: Secondary | ICD-10-CM | POA: Diagnosis not present

## 2020-10-16 DIAGNOSIS — I509 Heart failure, unspecified: Secondary | ICD-10-CM | POA: Diagnosis not present

## 2020-10-16 DIAGNOSIS — E039 Hypothyroidism, unspecified: Secondary | ICD-10-CM | POA: Diagnosis not present

## 2020-11-18 DIAGNOSIS — E1169 Type 2 diabetes mellitus with other specified complication: Secondary | ICD-10-CM | POA: Diagnosis not present

## 2020-11-18 DIAGNOSIS — I509 Heart failure, unspecified: Secondary | ICD-10-CM | POA: Diagnosis not present

## 2020-11-18 DIAGNOSIS — M170 Bilateral primary osteoarthritis of knee: Secondary | ICD-10-CM | POA: Diagnosis not present

## 2020-11-18 DIAGNOSIS — I10 Essential (primary) hypertension: Secondary | ICD-10-CM | POA: Diagnosis not present

## 2020-11-18 DIAGNOSIS — M179 Osteoarthritis of knee, unspecified: Secondary | ICD-10-CM | POA: Diagnosis not present

## 2020-11-18 DIAGNOSIS — E785 Hyperlipidemia, unspecified: Secondary | ICD-10-CM | POA: Diagnosis not present

## 2020-11-18 DIAGNOSIS — K219 Gastro-esophageal reflux disease without esophagitis: Secondary | ICD-10-CM | POA: Diagnosis not present

## 2020-11-18 DIAGNOSIS — E039 Hypothyroidism, unspecified: Secondary | ICD-10-CM | POA: Diagnosis not present

## 2020-11-27 DIAGNOSIS — M179 Osteoarthritis of knee, unspecified: Secondary | ICD-10-CM | POA: Diagnosis not present

## 2020-11-27 DIAGNOSIS — I509 Heart failure, unspecified: Secondary | ICD-10-CM | POA: Diagnosis not present

## 2020-11-27 DIAGNOSIS — K219 Gastro-esophageal reflux disease without esophagitis: Secondary | ICD-10-CM | POA: Diagnosis not present

## 2020-11-27 DIAGNOSIS — E785 Hyperlipidemia, unspecified: Secondary | ICD-10-CM | POA: Diagnosis not present

## 2020-11-27 DIAGNOSIS — M170 Bilateral primary osteoarthritis of knee: Secondary | ICD-10-CM | POA: Diagnosis not present

## 2020-11-27 DIAGNOSIS — N1831 Chronic kidney disease, stage 3a: Secondary | ICD-10-CM | POA: Diagnosis not present

## 2020-11-27 DIAGNOSIS — E1169 Type 2 diabetes mellitus with other specified complication: Secondary | ICD-10-CM | POA: Diagnosis not present

## 2020-11-27 DIAGNOSIS — E039 Hypothyroidism, unspecified: Secondary | ICD-10-CM | POA: Diagnosis not present

## 2020-11-27 DIAGNOSIS — I10 Essential (primary) hypertension: Secondary | ICD-10-CM | POA: Diagnosis not present

## 2020-12-16 DIAGNOSIS — E785 Hyperlipidemia, unspecified: Secondary | ICD-10-CM | POA: Diagnosis not present

## 2020-12-16 DIAGNOSIS — K219 Gastro-esophageal reflux disease without esophagitis: Secondary | ICD-10-CM | POA: Diagnosis not present

## 2020-12-16 DIAGNOSIS — I10 Essential (primary) hypertension: Secondary | ICD-10-CM | POA: Diagnosis not present

## 2020-12-16 DIAGNOSIS — Z7984 Long term (current) use of oral hypoglycemic drugs: Secondary | ICD-10-CM | POA: Diagnosis not present

## 2020-12-16 DIAGNOSIS — E1169 Type 2 diabetes mellitus with other specified complication: Secondary | ICD-10-CM | POA: Diagnosis not present

## 2020-12-16 DIAGNOSIS — E039 Hypothyroidism, unspecified: Secondary | ICD-10-CM | POA: Diagnosis not present

## 2020-12-16 DIAGNOSIS — N1831 Chronic kidney disease, stage 3a: Secondary | ICD-10-CM | POA: Diagnosis not present

## 2021-01-21 DIAGNOSIS — I10 Essential (primary) hypertension: Secondary | ICD-10-CM | POA: Diagnosis not present

## 2021-01-21 DIAGNOSIS — E785 Hyperlipidemia, unspecified: Secondary | ICD-10-CM | POA: Diagnosis not present

## 2021-01-21 DIAGNOSIS — E039 Hypothyroidism, unspecified: Secondary | ICD-10-CM | POA: Diagnosis not present

## 2021-01-21 DIAGNOSIS — K219 Gastro-esophageal reflux disease without esophagitis: Secondary | ICD-10-CM | POA: Diagnosis not present

## 2021-01-21 DIAGNOSIS — E1169 Type 2 diabetes mellitus with other specified complication: Secondary | ICD-10-CM | POA: Diagnosis not present

## 2021-01-21 DIAGNOSIS — I509 Heart failure, unspecified: Secondary | ICD-10-CM | POA: Diagnosis not present

## 2021-01-21 DIAGNOSIS — N1831 Chronic kidney disease, stage 3a: Secondary | ICD-10-CM | POA: Diagnosis not present

## 2021-03-26 DIAGNOSIS — Z23 Encounter for immunization: Secondary | ICD-10-CM | POA: Diagnosis not present

## 2021-05-22 DIAGNOSIS — E1169 Type 2 diabetes mellitus with other specified complication: Secondary | ICD-10-CM | POA: Diagnosis not present

## 2021-05-22 DIAGNOSIS — E785 Hyperlipidemia, unspecified: Secondary | ICD-10-CM | POA: Diagnosis not present

## 2021-05-22 DIAGNOSIS — N1831 Chronic kidney disease, stage 3a: Secondary | ICD-10-CM | POA: Diagnosis not present

## 2021-05-22 DIAGNOSIS — E039 Hypothyroidism, unspecified: Secondary | ICD-10-CM | POA: Diagnosis not present

## 2021-05-22 DIAGNOSIS — K219 Gastro-esophageal reflux disease without esophagitis: Secondary | ICD-10-CM | POA: Diagnosis not present

## 2021-05-22 DIAGNOSIS — I10 Essential (primary) hypertension: Secondary | ICD-10-CM | POA: Diagnosis not present

## 2021-06-14 ENCOUNTER — Emergency Department (HOSPITAL_COMMUNITY)
Admission: EM | Admit: 2021-06-14 | Discharge: 2021-06-15 | Disposition: A | Discharge status: Home / Self Care | Payer: Medicare PPO | Attending: Emergency Medicine | Admitting: Emergency Medicine

## 2021-06-14 DIAGNOSIS — R110 Nausea: Secondary | ICD-10-CM | POA: Diagnosis not present

## 2021-06-14 DIAGNOSIS — T18108A Unspecified foreign body in esophagus causing other injury, initial encounter: Secondary | ICD-10-CM | POA: Diagnosis not present

## 2021-06-14 DIAGNOSIS — Z5321 Procedure and treatment not carried out due to patient leaving prior to being seen by health care provider: Secondary | ICD-10-CM | POA: Diagnosis not present

## 2021-06-14 DIAGNOSIS — T18190A Other foreign object in esophagus causing compression of trachea, initial encounter: Secondary | ICD-10-CM | POA: Diagnosis not present

## 2021-06-14 DIAGNOSIS — I10 Essential (primary) hypertension: Secondary | ICD-10-CM | POA: Diagnosis not present

## 2021-06-14 DIAGNOSIS — X58XXXA Exposure to other specified factors, initial encounter: Secondary | ICD-10-CM | POA: Diagnosis not present

## 2021-06-15 ENCOUNTER — Encounter (HOSPITAL_COMMUNITY): Payer: Self-pay

## 2021-06-15 LAB — BASIC METABOLIC PANEL
Anion gap: 9 (ref 5–15)
BUN: 15 mg/dL (ref 8–23)
CO2: 23 mmol/L (ref 22–32)
Calcium: 9.7 mg/dL (ref 8.9–10.3)
Chloride: 106 mmol/L (ref 98–111)
Creatinine, Ser: 1.26 mg/dL — ABNORMAL HIGH (ref 0.44–1.00)
GFR, Estimated: 46 mL/min — ABNORMAL LOW (ref >60)
Glucose, Bld: 99 mg/dL (ref 70–99)
Potassium: 3.5 mmol/L (ref 3.5–5.1)
Sodium: 138 mmol/L (ref 135–145)

## 2021-06-15 LAB — CBC WITH DIFFERENTIAL/PLATELET
Abs Immature Granulocytes: 0.01 10*3/uL (ref 0.00–0.07)
Basophils Absolute: 0 10*3/uL (ref 0.0–0.1)
Basophils Relative: 1 %
Eosinophils Absolute: 0.2 10*3/uL (ref 0.0–0.5)
Eosinophils Relative: 3 %
HCT: 42 % (ref 36.0–46.0)
Hemoglobin: 13.2 g/dL (ref 12.0–15.0)
Immature Granulocytes: 0 %
Lymphocytes Relative: 57 %
Lymphs Abs: 4.8 10*3/uL — ABNORMAL HIGH (ref 0.7–4.0)
MCH: 27.2 pg (ref 26.0–34.0)
MCHC: 31.4 g/dL (ref 30.0–36.0)
MCV: 86.6 fL (ref 80.0–100.0)
Monocytes Absolute: 0.7 10*3/uL (ref 0.1–1.0)
Monocytes Relative: 8 %
Neutro Abs: 2.6 10*3/uL (ref 1.7–7.7)
Neutrophils Relative %: 31 %
Platelets: 300 10*3/uL (ref 150–400)
RBC: 4.85 MIL/uL (ref 3.87–5.11)
RDW: 14.2 % (ref 11.5–15.5)
WBC: 8.4 10*3/uL (ref 4.0–10.5)
nRBC: 0 % (ref 0.0–0.2)

## 2021-06-15 NOTE — ED Provider Triage Note (Signed)
Emergency Medicine Provider Triage Evaluation Note  Heidi Maddox , a 71 y.o. female  was evaluated in triage.  Pt complains of pill stuck in her throat onset tonight prior to arrival.  She was attempting to take a vitamin when he became stuck in her throat.  Patient has associated nausea.  Has not been medications for symptoms.  Denies vomiting, fever, chills, chest pain, shortness of breath.  Review of Systems  Positive: As per HPI above. Negative: Fever, chills  Physical Exam  BP (!) 124/96    Pulse 60    Temp 98.5 F (36.9 C) (Oral)    Resp 20    Ht 5\' 5"  (1.651 m)    Wt 106.6 kg    SpO2 100%    BMI 39.11 kg/m  Gen:   Awake, no distress   Resp:  Normal effort  MSK:   Moves extremities without difficulty  Other:   Patent airway. Able to tolerate secretions.  Medical Decision Making  Medically screening exam initiated at 12:03 AM.  Appropriate orders placed.  TRACIE DORE was informed that the remainder of the evaluation will be completed by another provider, this initial triage assessment does not replace that evaluation, and the importance of remaining in the ED until their evaluation is complete.     Sharetta Ricchio A, PA-C 06/15/21 0005

## 2021-06-15 NOTE — ED Notes (Signed)
Patient states she thinks the pill went down her throat and she has a doctor appointment scheduled for later this week. She is leaving

## 2021-06-15 NOTE — ED Triage Notes (Signed)
Pt arrives POV for eval of possible pill stuck in throat. Pt reports she was taking a vitamin and noted the vitamin became stuck in her throat. Able to breathe and manage own secretions.

## 2021-06-16 DIAGNOSIS — Z7984 Long term (current) use of oral hypoglycemic drugs: Secondary | ICD-10-CM | POA: Diagnosis not present

## 2021-06-16 DIAGNOSIS — F411 Generalized anxiety disorder: Secondary | ICD-10-CM | POA: Diagnosis not present

## 2021-06-16 DIAGNOSIS — K219 Gastro-esophageal reflux disease without esophagitis: Secondary | ICD-10-CM | POA: Diagnosis not present

## 2021-06-16 DIAGNOSIS — E1169 Type 2 diabetes mellitus with other specified complication: Secondary | ICD-10-CM | POA: Diagnosis not present

## 2021-06-30 DIAGNOSIS — I509 Heart failure, unspecified: Secondary | ICD-10-CM | POA: Diagnosis not present

## 2021-06-30 DIAGNOSIS — I10 Essential (primary) hypertension: Secondary | ICD-10-CM | POA: Diagnosis not present

## 2021-06-30 DIAGNOSIS — K219 Gastro-esophageal reflux disease without esophagitis: Secondary | ICD-10-CM | POA: Diagnosis not present

## 2021-06-30 DIAGNOSIS — E785 Hyperlipidemia, unspecified: Secondary | ICD-10-CM | POA: Diagnosis not present

## 2021-06-30 DIAGNOSIS — E1169 Type 2 diabetes mellitus with other specified complication: Secondary | ICD-10-CM | POA: Diagnosis not present

## 2021-06-30 DIAGNOSIS — Z7984 Long term (current) use of oral hypoglycemic drugs: Secondary | ICD-10-CM | POA: Diagnosis not present

## 2021-06-30 DIAGNOSIS — R7303 Prediabetes: Secondary | ICD-10-CM | POA: Diagnosis not present

## 2021-07-02 DIAGNOSIS — Z0000 Encounter for general adult medical examination without abnormal findings: Secondary | ICD-10-CM | POA: Diagnosis not present

## 2021-10-02 DIAGNOSIS — K219 Gastro-esophageal reflux disease without esophagitis: Secondary | ICD-10-CM | POA: Diagnosis not present

## 2021-10-02 DIAGNOSIS — E039 Hypothyroidism, unspecified: Secondary | ICD-10-CM | POA: Diagnosis not present

## 2021-10-02 DIAGNOSIS — I10 Essential (primary) hypertension: Secondary | ICD-10-CM | POA: Diagnosis not present

## 2021-10-02 DIAGNOSIS — E1169 Type 2 diabetes mellitus with other specified complication: Secondary | ICD-10-CM | POA: Diagnosis not present

## 2021-10-02 DIAGNOSIS — E785 Hyperlipidemia, unspecified: Secondary | ICD-10-CM | POA: Diagnosis not present

## 2021-12-08 DIAGNOSIS — T300 Burn of unspecified body region, unspecified degree: Secondary | ICD-10-CM | POA: Diagnosis not present

## 2021-12-08 DIAGNOSIS — E1169 Type 2 diabetes mellitus with other specified complication: Secondary | ICD-10-CM | POA: Diagnosis not present

## 2021-12-14 DIAGNOSIS — T300 Burn of unspecified body region, unspecified degree: Secondary | ICD-10-CM | POA: Diagnosis not present

## 2021-12-23 ENCOUNTER — Other Ambulatory Visit: Payer: Self-pay | Admitting: Internal Medicine

## 2021-12-23 DIAGNOSIS — Z1231 Encounter for screening mammogram for malignant neoplasm of breast: Secondary | ICD-10-CM

## 2021-12-28 DIAGNOSIS — I6529 Occlusion and stenosis of unspecified carotid artery: Secondary | ICD-10-CM | POA: Diagnosis not present

## 2021-12-28 DIAGNOSIS — K219 Gastro-esophageal reflux disease without esophagitis: Secondary | ICD-10-CM | POA: Diagnosis not present

## 2021-12-28 DIAGNOSIS — I10 Essential (primary) hypertension: Secondary | ICD-10-CM | POA: Diagnosis not present

## 2021-12-28 DIAGNOSIS — E1169 Type 2 diabetes mellitus with other specified complication: Secondary | ICD-10-CM | POA: Diagnosis not present

## 2021-12-28 DIAGNOSIS — I739 Peripheral vascular disease, unspecified: Secondary | ICD-10-CM | POA: Diagnosis not present

## 2021-12-31 ENCOUNTER — Ambulatory Visit
Admission: RE | Admit: 2021-12-31 | Discharge: 2021-12-31 | Disposition: A | Discharge status: Home / Self Care | Payer: Medicare Other | Source: Ambulatory Visit | Attending: Internal Medicine | Admitting: Internal Medicine

## 2021-12-31 DIAGNOSIS — Z1231 Encounter for screening mammogram for malignant neoplasm of breast: Secondary | ICD-10-CM

## 2022-05-28 IMAGING — MG DIGITAL SCREENING BILAT W/ CAD
5 series · 5 of 5 positions shown · non-contrast
Comparison: Previous exam(s).

CLINICAL DATA: Screening.

EXAM:
DIGITAL SCREENING BILATERAL MAMMOGRAM WITH CAD
TECHNIQUE: Bilateral screening digital craniocaudal and mediolateral oblique
mammograms were obtained. The images were evaluated with
computer-aided detection.

[L MLO]
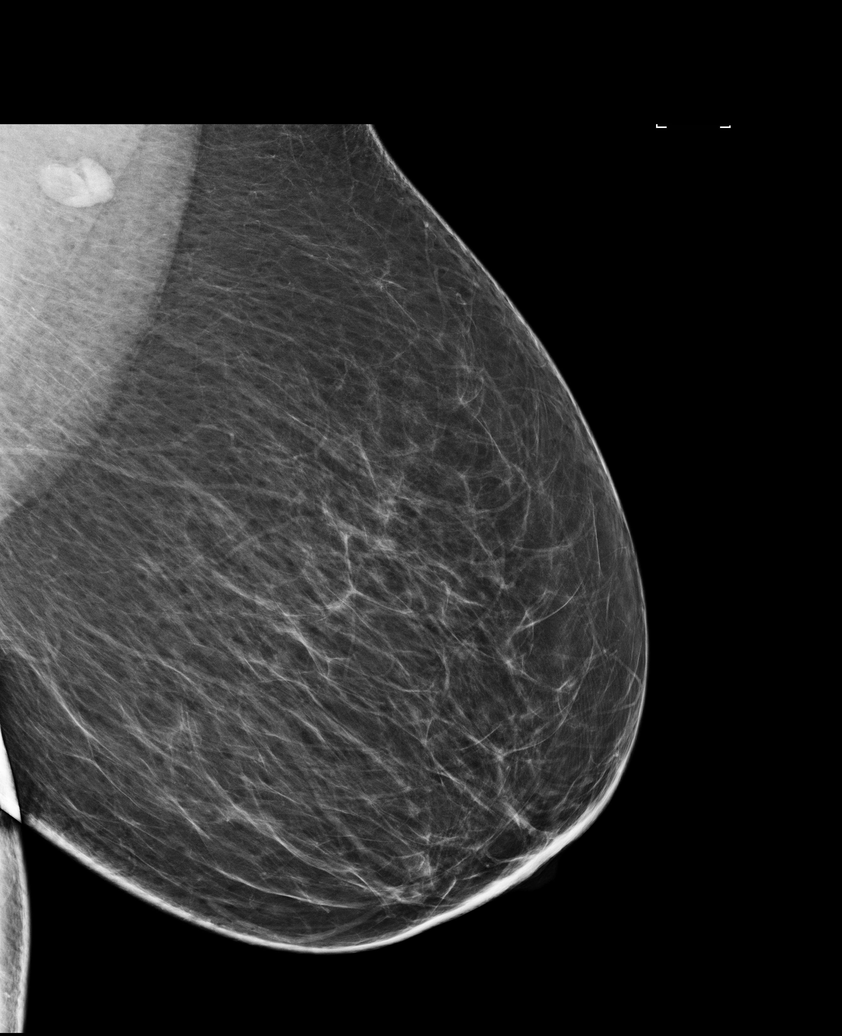

[R CC]
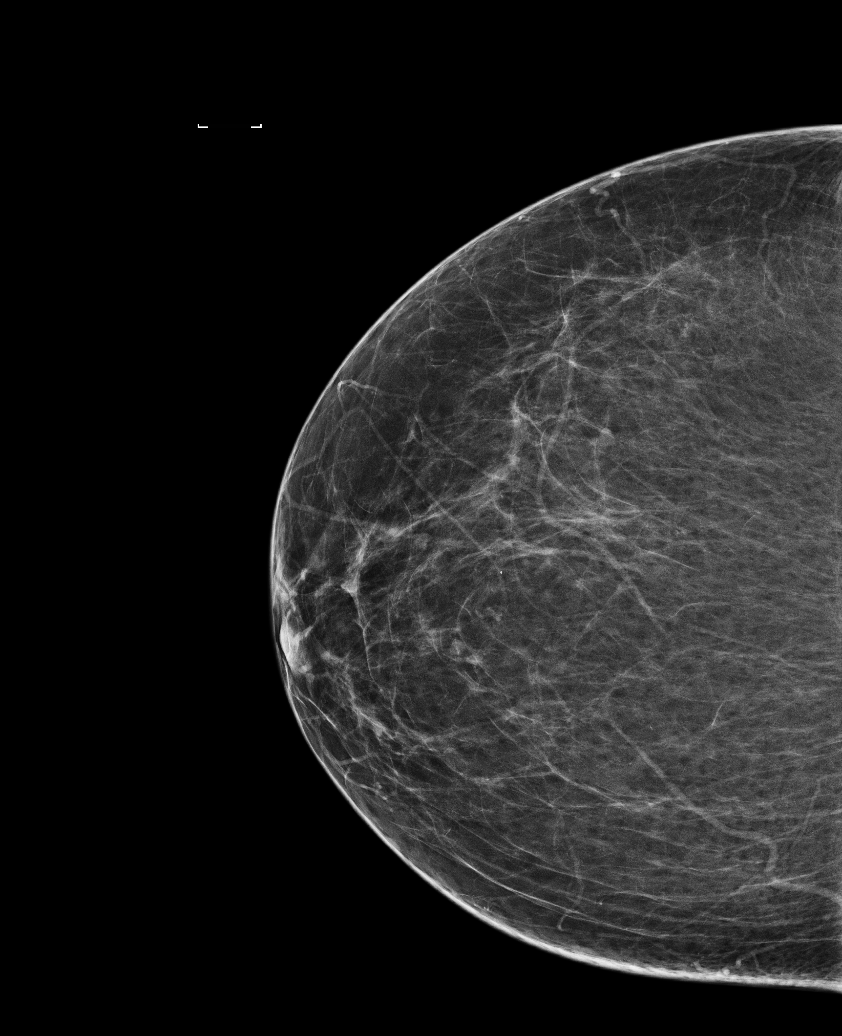

[L CC]
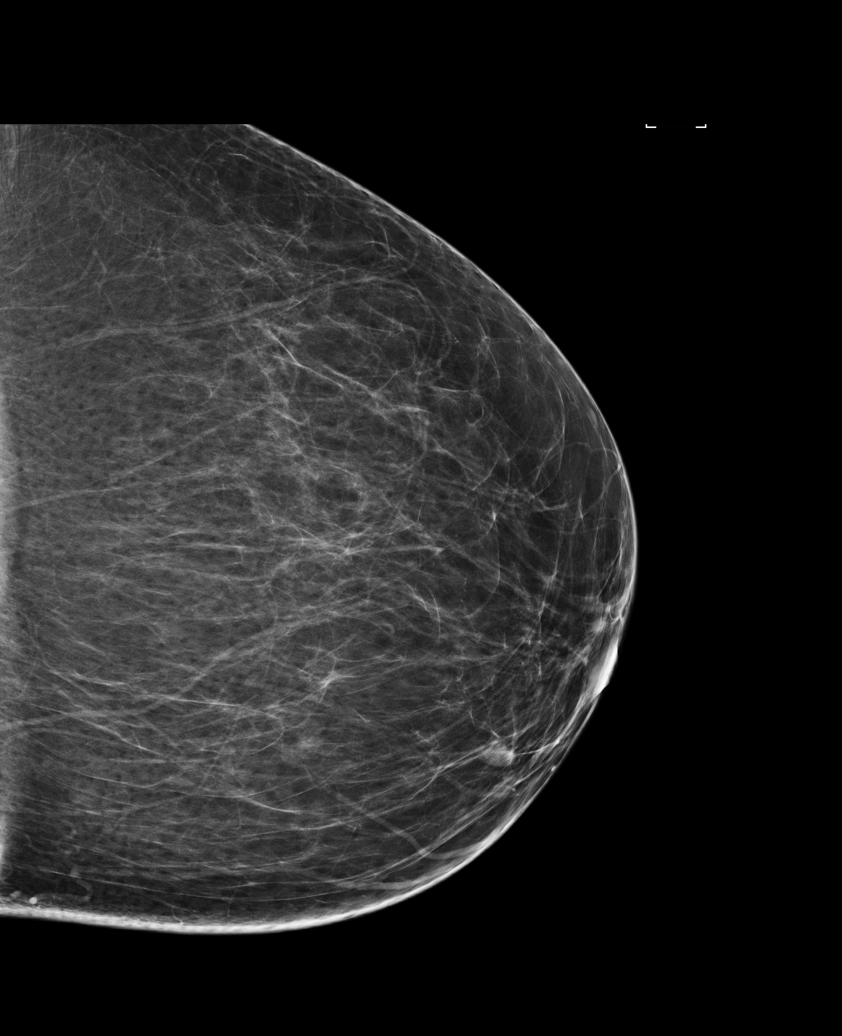

[R MLO (1 of 2)]
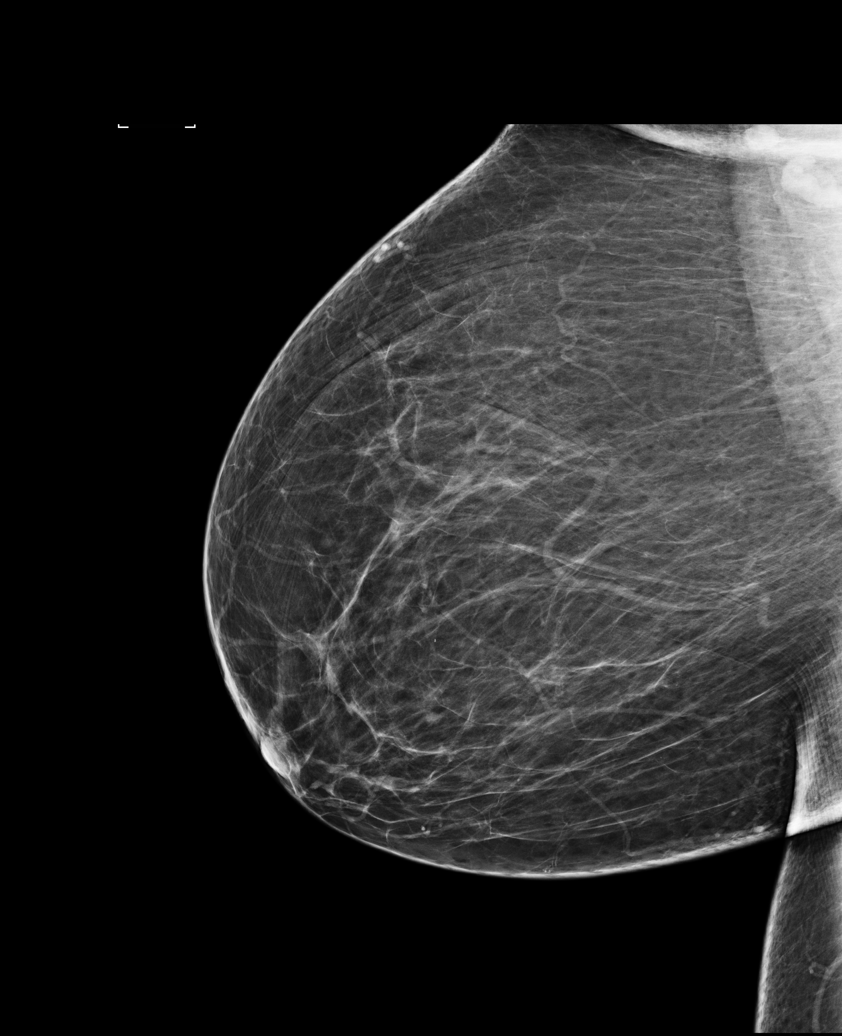

[R MLO (2 of 2)]
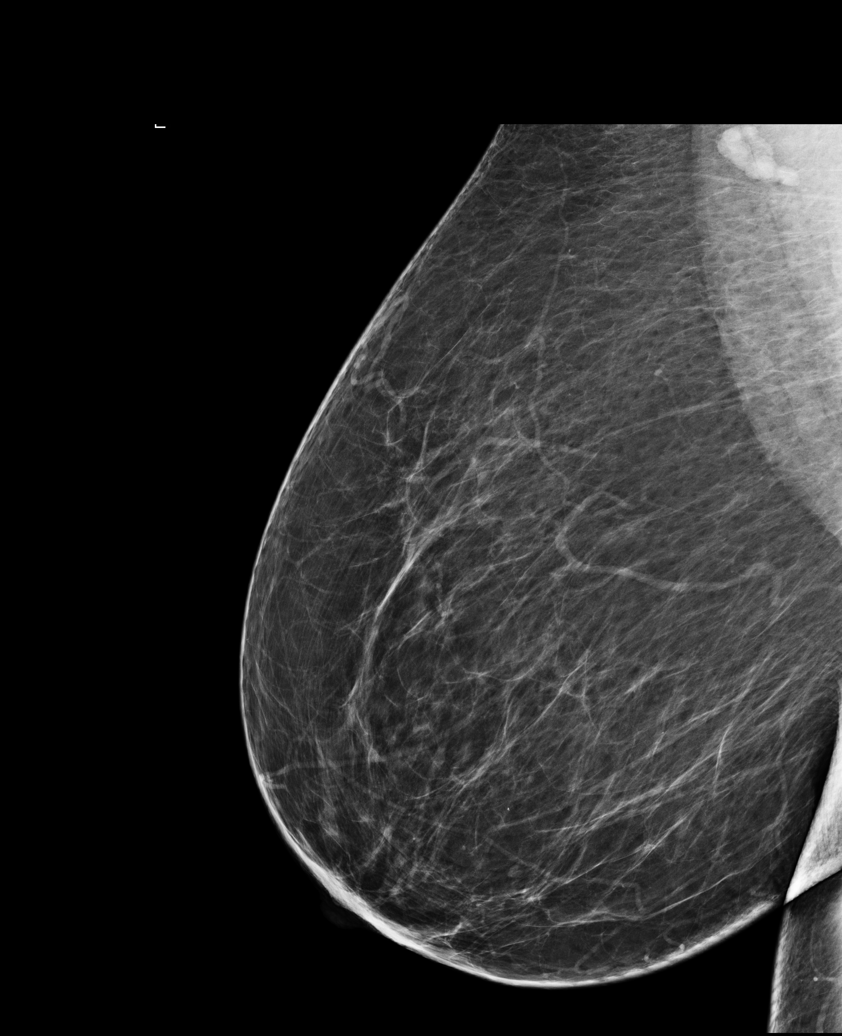

[5 of 5 positions shown; findings below may reference images not displayed]

ACR Breast Density Category b: There are scattered areas of
fibroglandular density.
FINDINGS: There are no findings suspicious for malignancy. The images were
evaluated with computer-aided detection.
IMPRESSION: No mammographic evidence of malignancy. A result letter of this
screening mammogram will be mailed directly to the patient.

RECOMMENDATION:
Screening mammogram in one year. (Code:[id number])

BI-RADS CATEGORY  1: Negative.

## 2022-08-25 DIAGNOSIS — J309 Allergic rhinitis, unspecified: Secondary | ICD-10-CM | POA: Diagnosis not present

## 2022-08-25 DIAGNOSIS — Z23 Encounter for immunization: Secondary | ICD-10-CM | POA: Diagnosis not present

## 2022-08-25 DIAGNOSIS — R202 Paresthesia of skin: Secondary | ICD-10-CM | POA: Diagnosis not present

## 2022-08-25 DIAGNOSIS — I739 Peripheral vascular disease, unspecified: Secondary | ICD-10-CM | POA: Diagnosis not present

## 2022-08-25 DIAGNOSIS — Z0000 Encounter for general adult medical examination without abnormal findings: Secondary | ICD-10-CM | POA: Diagnosis not present

## 2022-08-25 DIAGNOSIS — I10 Essential (primary) hypertension: Secondary | ICD-10-CM | POA: Diagnosis not present

## 2022-08-25 DIAGNOSIS — M79659 Pain in unspecified thigh: Secondary | ICD-10-CM | POA: Diagnosis not present

## 2022-08-25 DIAGNOSIS — E785 Hyperlipidemia, unspecified: Secondary | ICD-10-CM | POA: Diagnosis not present

## 2022-08-25 DIAGNOSIS — E1151 Type 2 diabetes mellitus with diabetic peripheral angiopathy without gangrene: Secondary | ICD-10-CM | POA: Diagnosis not present

## 2022-09-02 DIAGNOSIS — Z0000 Encounter for general adult medical examination without abnormal findings: Secondary | ICD-10-CM | POA: Diagnosis not present

## 2022-10-08 ENCOUNTER — Other Ambulatory Visit: Payer: Self-pay | Admitting: Internal Medicine

## 2022-10-08 DIAGNOSIS — R6889 Other general symptoms and signs: Secondary | ICD-10-CM

## 2022-10-30 LAB — AMB RESULTS CONSOLE CBG: Glucose: 102

## 2022-10-30 NOTE — Progress Notes (Signed)
Pt  has PCP. No SDOH needs at this time.  

## 2022-11-09 ENCOUNTER — Ambulatory Visit
Admission: RE | Admit: 2022-11-09 | Discharge: 2022-11-09 | Disposition: A | Discharge status: Home / Self Care | Payer: Medicare Other | Source: Ambulatory Visit | Attending: Internal Medicine | Admitting: Internal Medicine

## 2022-11-09 DIAGNOSIS — I739 Peripheral vascular disease, unspecified: Secondary | ICD-10-CM | POA: Diagnosis not present

## 2022-11-09 DIAGNOSIS — I70203 Unspecified atherosclerosis of native arteries of extremities, bilateral legs: Secondary | ICD-10-CM | POA: Diagnosis not present

## 2022-11-09 DIAGNOSIS — R6889 Other general symptoms and signs: Secondary | ICD-10-CM

## 2022-11-30 ENCOUNTER — Encounter: Payer: Self-pay | Admitting: *Deleted

## 2022-11-30 NOTE — Progress Notes (Signed)
Pt attended 10/30/22 screening event where her b/p was 129/76 and her blood sugar was 102. The event nurse documented pt had PCP but pt did not include PCP name on health screening form. Pt did not identify any SDOH insecurities at the event. Chart review indicates pt sees Dr. Lorenda Ishihara at [facility], with last office visit occurring on 12/28/2021 and most recent external referral by her PCP for her for vascular f/u being made on 11/09/22. No additional health equity team support indicated at this time.

## 2022-12-12 NOTE — Progress Notes (Unsigned)
[facility] [place]  ASSESSMENT / PLAN: Heidi Maddox is a 72 y.o. female with atherosclerosis of native arteries of bilateral lower extremities which is asymptomatic. I think her bilateral hip discomfort is not likely to be vascular because of her normal femoral pulses.   Recommend:  Abstinence from all tobacco products. Blood glucose control with goal A1c < 7%. Blood pressure control with goal blood pressure < 140/90 mmHg. Lipid reduction therapy with goal LDL-C <100 mg/dL. Aspirin 81mg  PO QD.  Atorvastatin 40-80mg  PO QD (or other "high intensity" statin therapy).  Patient's non-invasive testing is more worrisome than her history and exam. Patient counseled on limb threatening symptoms and to return if these develop. Otherwise I will see her in 3 months with repeat ABI.   CHIEF COMPLAINT: hip pain  HISTORY OF PRESENT ILLNESS: Heidi Maddox is a 72 y.o. female who presents to clinic for evaluation of severe peripheral arterial disease on non-invasive testing.  The patient reports severe, bilateral hip pain with ambulation and standing.  She has point tenderness at her greater trochanter.  She reports the symptoms have been worsening over the past several weeks, but present for some time.  She does not describe cramping discomfort in her buttock, thighs, or calves.  She does not describe severe pain in her feet.  She does describe neuropathic type discomfort which she describes as burning and tingling. She has no ulcers about her feet.   Past Medical History:  Diagnosis Date   Allergy    CHF (congestive heart failure) (HCC)    Diabetes mellitus without complication (HCC)    Hyperlipidemia    Hypertension    Thyroid disease     History reviewed. No pertinent surgical history.  History reviewed. No pertinent family history.  Social History   Socioeconomic History   Marital status: Divorced    Spouse name: Not on file   Number of children: Not on file    Years of education: Not on file   Highest education level: Not on file  Occupational History   Not on file  Tobacco Use   Smoking status: Every Day    Current packs/day: 0.25    Types: Cigarettes   Smokeless tobacco: Never  Substance and Sexual Activity   Alcohol use: No   Drug use: Not on file   Sexual activity: Not on file  Other Topics Concern   Not on file  Social History Narrative   Not on file   Social Determinants of Health   Financial Resource Strain: Not on file  Food Insecurity: No Food Insecurity (10/30/2022)   Hunger Vital Sign    Worried About Running Out of Food in the Last Year: Never true    Ran Out of Food in the Last Year: Never true  Transportation Needs: No Transportation Needs (10/30/2022)   PRAPARE - Administrator, Civil Service (Medical): No    Lack of Transportation (Non-Medical): No  Physical Activity: Not on file  Stress: Not on file  Social Connections: Not on file  Intimate Partner Violence: Not At Risk (10/30/2022)   Humiliation, Afraid, Rape, and Kick questionnaire    Fear of Current or Ex-Partner: No    Emotionally Abused: No    Physically Abused: No    Sexually Abused: No    No Known Allergies  Current Outpatient Medications  Medication Sig Dispense Refill   aspirin EC 81 MG tablet Take 81 mg by mouth daily.  buPROPion (WELLBUTRIN XL) 300 MG 24 hr tablet Take 300 mg by mouth every morning.     busPIRone (BUSPAR) 5 MG tablet Take 5 mg by mouth 2 (two) times daily.     cholecalciferol (VITAMIN D) 1000 units tablet Take 1,000 Units by mouth daily.     famotidine (PEPCID) 20 MG tablet Take 20 mg by mouth at bedtime as needed.     furosemide (LASIX) 20 MG tablet Take 20 mg by mouth daily as needed for fluid or edema.   11   HYDROcodone-homatropine (HYCODAN) 5-1.5 MG/5ML syrup Take 5 mLs by mouth every 6 (six) hours as needed for cough. 90 mL 0   levothyroxine (SYNTHROID, LEVOTHROID) 25 MCG tablet Take 25 mcg by mouth daily.  4    metFORMIN (GLUCOPHAGE-XR) 500 MG 24 hr tablet Take 500 mg by mouth at bedtime.     Multiple Vitamins-Minerals (MULTIVITAMIN ADULT PO) Take 1 tablet by mouth daily.     Omega-3 Fatty Acids (FISH OIL) 1000 MG CAPS Take 1,000 mg by mouth daily.     OZEMPIC, 2 MG/DOSE, 8 MG/3ML SOPN Inject 0.5 mg into the skin once a week.     rosuvastatin (CRESTOR) 40 MG tablet Take 40 mg by mouth daily.  4   sertraline (ZOLOFT) 100 MG tablet Take 100 mg by mouth daily.  4   traMADol (ULTRAM) 50 MG tablet Take 50-100 mg by mouth every 8 (eight) hours as needed for moderate pain or severe pain.   0   TRIBENZOR 40-10-25 MG TABS Take 1 tablet by mouth daily.  11   oxyCODONE (ROXICODONE) 5 MG immediate release tablet Take 0.5 tablets (2.5 mg total) by mouth every 4 (four) hours as needed for severe pain. (Patient not taking: Reported on 12/13/2022) 5 tablet 0   No current facility-administered medications for this visit.    PHYSICAL EXAM Vitals:   12/13/22 0954  BP: (!) 142/84  Pulse: 77  Resp: 20  Temp: 98.1 F (36.7 C)  SpO2: 93%  Weight: 221 lb (100.2 kg)  Height: 5\' 5"  (1.651 m)    Obese elderly woman in no distress Regular rate and rhythm Unlabored breathing 2+ femoral pulses No palpable pedal pulses  PERTINENT LABORATORY AND RADIOLOGIC DATA  Most recent CBC    Latest Ref Rng & Units 06/15/2021   12:06 AM 10/27/2015    1:44 PM 06/23/2010   11:27 AM  CBC  WBC 4.0 - 10.5 K/uL 8.4  6.8    Hemoglobin 12.0 - 15.0 g/dL 16.1  09.6  04.5   Hematocrit 36.0 - 46.0 % 42.0  37.5  41.0   Platelets 150 - 400 K/uL 300  284       Most recent CMP    Latest Ref Rng & Units 06/15/2021   12:06 AM 10/27/2015    1:44 PM 06/23/2010   11:27 AM  CMP  Glucose 70 - 99 mg/dL 99  409  811   BUN 8 - 23 mg/dL 15  12    Creatinine 9.14 - 1.00 mg/dL 7.82  9.56    Sodium 213 - 145 mmol/L 138  140  144   Potassium 3.5 - 5.1 mmol/L 3.5  3.0  3.5   Chloride 98 - 111 mmol/L 106  106    CO2 22 - 32 mmol/L 23  26     Calcium 8.9 - 10.3 mg/dL 9.7  9.3    Total Protein 6.5 - 8.1 g/dL  7.3    Total Bilirubin  0.3 - 1.2 mg/dL  0.6    Alkaline Phos 38 - 126 U/L  76    AST 15 - 41 U/L  20    ALT 14 - 54 U/L  20     Right Lower Extremity   ABI: 0.3   Inflow: Normal common femoral arterial waveforms and velocities. No evidence of inflow (aortoiliac) disease.   Outflow: No flow visualized within the superficial femoral artery in the mid thigh consistent with chronic occlusion.   Runoff: Minimal monophasic arterial flow.   Left Lower Extremity   ABI: 0.3   Inflow: Normal common femoral arterial waveforms and velocities. No evidence of inflow (aortoiliac) disease.   Outflow: No flow visualized within the proximal and mid superficial femoral artery.   Runoff: Minimal monophasic arterial flow.    Rande Brunt. Lenell Antu, MD [facility] Phone Number: [phone] 12/13/2022 1:53 PM   Total time spent on preparing this encounter including chart review, data review, collecting history, examining the patient, coordinating care for this new patient, 60 minutes.  Portions of this report may have been transcribed using voice recognition software.  Every effort has been made to ensure accuracy; however, inadvertent computerized transcription errors may still be present.

## 2022-12-13 ENCOUNTER — Ambulatory Visit: Payer: Medicare Other | Admitting: Vascular Surgery

## 2022-12-13 ENCOUNTER — Encounter: Payer: Self-pay | Admitting: Vascular Surgery

## 2022-12-13 VITALS — BP 142/84 | HR 77 | Temp 98.1°F | Resp 20 | Ht 65.0 in | Wt 221.0 lb

## 2022-12-13 DIAGNOSIS — I739 Peripheral vascular disease, unspecified: Secondary | ICD-10-CM | POA: Diagnosis not present

## 2022-12-15 ENCOUNTER — Other Ambulatory Visit: Payer: Self-pay

## 2022-12-15 DIAGNOSIS — I739 Peripheral vascular disease, unspecified: Secondary | ICD-10-CM

## 2022-12-27 DIAGNOSIS — N1831 Chronic kidney disease, stage 3a: Secondary | ICD-10-CM | POA: Diagnosis not present

## 2022-12-27 DIAGNOSIS — E1122 Type 2 diabetes mellitus with diabetic chronic kidney disease: Secondary | ICD-10-CM | POA: Diagnosis not present

## 2022-12-27 DIAGNOSIS — K219 Gastro-esophageal reflux disease without esophagitis: Secondary | ICD-10-CM | POA: Diagnosis not present

## 2022-12-27 DIAGNOSIS — E1136 Type 2 diabetes mellitus with diabetic cataract: Secondary | ICD-10-CM | POA: Diagnosis not present

## 2022-12-27 DIAGNOSIS — E1151 Type 2 diabetes mellitus with diabetic peripheral angiopathy without gangrene: Secondary | ICD-10-CM | POA: Diagnosis not present

## 2023-01-03 DIAGNOSIS — K6289 Other specified diseases of anus and rectum: Secondary | ICD-10-CM | POA: Diagnosis not present

## 2023-01-03 DIAGNOSIS — Z1211 Encounter for screening for malignant neoplasm of colon: Secondary | ICD-10-CM | POA: Diagnosis not present

## 2023-01-06 ENCOUNTER — Ambulatory Visit (HOSPITAL_COMMUNITY)
Admission: EM | Admit: 2023-01-06 | Discharge: 2023-01-06 | Disposition: A | Discharge status: Home / Self Care | Payer: Medicare Other | Attending: Physician Assistant | Admitting: Physician Assistant

## 2023-01-06 ENCOUNTER — Encounter (HOSPITAL_COMMUNITY): Payer: Self-pay

## 2023-01-06 DIAGNOSIS — M5441 Lumbago with sciatica, right side: Secondary | ICD-10-CM | POA: Diagnosis not present

## 2023-01-06 DIAGNOSIS — M5442 Lumbago with sciatica, left side: Secondary | ICD-10-CM | POA: Diagnosis not present

## 2023-01-06 MED ORDER — BACLOFEN 5 MG PO TABS: 5.0000 mg | ORAL_TABLET | Freq: Every evening | ORAL | 0 refills | Status: DC | PRN

## 2023-01-06 MED ORDER — PREDNISONE 20 MG PO TABS: 20.0000 mg | ORAL_TABLET | Freq: Every day | ORAL | 0 refills | Status: AC

## 2023-01-06 MED ORDER — LIDOCAINE 5 % EX PTCH: 1.0000 | MEDICATED_PATCH | CUTANEOUS | 0 refills | Status: DC

## 2023-01-06 NOTE — Discharge Instructions (Signed)
I believe that you have some injured muscles that are putting pressure on your nerves.  Start prednisone 20 mg for 4 days.  Do not take NSAIDs with this medication including aspirin, ibuprofen/Advil, naproxen/Aleve.  Use lidocaine patch during the day.  Remove this at night.  Use only 1 patch per 24 hours.  Take baclofen at night.  This will make you sleepy so do not drive drink alcohol while taking it and take it just before you go to bed to prevent falls.  If your symptoms or not improving quickly please follow-up with sports medicine as you may benefit from imaging and/or physical therapy that we cannot arrange in urgent care.  Call them to schedule an appointment.  If anything worsens you have increasing pain, fever, nausea, vomiting, going to the bathroom yourself without noticing it, weakness in your legs, numbness or tingling in your legs you need to be seen immediately.

## 2023-01-06 NOTE — ED Triage Notes (Signed)
Pt c/o lower back/hip pain radiating down both legs x2wks after flipping her mattress over. Taking tylenol with no relief. States having to use a cane to get around.

## 2023-01-06 NOTE — ED Provider Notes (Signed)
[facility]    CSN: [id number] Arrival date & time: 01/06/23  1410      History   Chief Complaint Chief Complaint  Patient presents with   Back Pain    HPI Heidi Maddox is a 72 y.o. female.   Patient presents today with a 2-week history of lower back pain that radiates into bilateral legs.  She reports that symptoms began after she attempted to flip her mattress at home and believes that she moved wrong causing injury to her back.  She reports pain has been persistent and slightly worsened since symptoms began.  She denies any additional injury or trauma including car accident.  Denies any history of spinal surgery.  Pain is rated 8 on a 0-10 pain scale, localized to lumbar region, described as throbbing/aching, worse with activity, no alleviating factors identified.  She has tried Tylenol without improvement of symptoms.  She is unable to take ibuprofen.  Denies any numbness or paresthesias in her legs.  Denies any bowel/bladder incontinence, saddle anesthesia, lower extremity weakness.  She denies personal history of malignancy.  Denies any urinary symptoms, fever, nausea, vomiting.    Past Medical History:  Diagnosis Date   Allergy    CHF (congestive heart failure) (HCC)    Diabetes mellitus without complication (HCC)    Hyperlipidemia    Hypertension    Thyroid disease     There are no problems to display for this patient.   History reviewed. No pertinent surgical history.  OB History   No obstetric history on file.      Home Medications    Prior to Admission medications   Medication Sig Start Date End Date Taking? Authorizing Provider  Baclofen 5 MG TABS Take 1 tablet (5 mg total) by mouth at bedtime as needed. 01/06/23  Yes ,  K, PA-C  lidocaine (LIDODERM) 5 % Place 1 patch onto the skin daily. Remove & Discard patch within 12 hours or as directed by MD 01/06/23  Yes , Denny Peon K, PA-C  predniSONE (DELTASONE) 20 MG tablet Take 1 tablet (20  mg total) by mouth daily for 4 days. 01/06/23 01/10/23 Yes , Noberto Retort, PA-C  aspirin EC 81 MG tablet Take 81 mg by mouth daily.    [provider]  buPROPion (WELLBUTRIN XL) 300 MG 24 hr tablet Take 300 mg by mouth every morning. 11/30/22   [provider]  busPIRone (BUSPAR) 5 MG tablet Take 5 mg by mouth 2 (two) times daily. 10/13/22   [provider]  cholecalciferol (VITAMIN D) 1000 units tablet Take 1,000 Units by mouth daily.    [provider]  famotidine (PEPCID) 20 MG tablet Take 20 mg by mouth at bedtime as needed. 11/30/22   [provider]  furosemide (LASIX) 20 MG tablet Take 20 mg by mouth daily as needed for fluid or edema.  09/17/15   [provider]  levothyroxine (SYNTHROID, LEVOTHROID) 25 MCG tablet Take 25 mcg by mouth daily. 10/06/15   [provider]  metFORMIN (GLUCOPHAGE-XR) 500 MG 24 hr tablet Take 500 mg by mouth at bedtime. 10/13/22   [provider]  Multiple Vitamins-Minerals (MULTIVITAMIN ADULT PO) Take 1 tablet by mouth daily.    [provider]  Omega-3 Fatty Acids (FISH OIL) 1000 MG CAPS Take 1,000 mg by mouth daily.    [provider]  OZEMPIC, 2 MG/DOSE, 8 MG/3ML SOPN Inject 0.5 mg into the skin once a week. 09/03/22   [provider]  rosuvastatin (CRESTOR) 40 MG tablet Take 40 mg by mouth daily. 09/18/15   [provider]  sertraline (ZOLOFT) 100 MG tablet Take 100 mg by mouth daily. 10/16/15   [provider]  TRIBENZOR 40-10-25 MG TABS Take 1 tablet by mouth daily. 10/06/15   [provider]    Family History History reviewed. No pertinent family history.  Social History Social History   Tobacco Use   Smoking status: Every Day    Current packs/day: 0.25    Types: Cigarettes   Smokeless tobacco: Never  Substance Use Topics   Alcohol use: No     Allergies   Patient has no known allergies.   Review of Systems Review of Systems   Constitutional:  Positive for activity change. Negative for appetite change, fatigue and fever.  Respiratory:  Negative for cough and shortness of breath.   Cardiovascular:  Negative for chest pain.  Gastrointestinal:  Negative for abdominal pain, diarrhea, nausea and vomiting.  Genitourinary:  Negative for dysuria, frequency and urgency.  Musculoskeletal:  Positive for back pain. Negative for arthralgias and myalgias.  Neurological:  Negative for weakness and numbness.     Physical Exam Triage Vital Signs ED Triage Vitals [01/06/23 1632]  Encounter Vitals Group     BP (!) 146/71     Systolic BP Percentile      Diastolic BP Percentile      Pulse Rate 75     Resp 18     Temp 98.3 F (36.8 C)     Temp Source Oral     SpO2      Weight      Height      Head Circumference      Peak Flow      Pain Score 9     Pain Loc      Pain Education      Exclude from Growth Chart    No data found.  Updated Vital Signs BP (!) 146/71 (BP Location: Left Arm)   Pulse 75   Temp 98.3 F (36.8 C) (Oral)   Resp 18   Visual Acuity Right Eye Distance:   Left Eye Distance:   Bilateral Distance:    Right Eye Near:   Left Eye Near:    Bilateral Near:     Physical Exam Vitals reviewed.  Constitutional:      General: She is awake. She is not in acute distress.    Appearance: Normal appearance. She is well-developed. She is not ill-appearing.     Comments: Very pleasant female appears stated age in no acute distress sitting comfortably in exam room  HENT:     Head: Normocephalic and atraumatic.  Cardiovascular:     Rate and Rhythm: Normal rate and regular rhythm.     Heart sounds: Normal heart sounds, S1 normal and S2 normal. No murmur heard. Pulmonary:     Effort: Pulmonary effort is normal.     Breath sounds: Normal breath sounds. No wheezing, rhonchi or rales.     Comments: Clear to auscultation bilaterally Abdominal:     Palpations: Abdomen is soft.     Tenderness: There is no  abdominal tenderness.  Musculoskeletal:     Cervical back: No tenderness or bony tenderness.     Thoracic back: No tenderness or bony tenderness.     Lumbar back: Tenderness present. No bony tenderness. Negative right straight leg raise test and negative left straight leg raise test.     Comments: Back:  No pain with percussion of vertebrae.  Mild tenderness palpation over bilateral lumbar paraspinal muscles.  No deformity or step-off noted.  Strength 5/5 bilateral lower extremities.  Negative straight leg raise bilaterally.  Psychiatric:        Behavior: Behavior is cooperative.      UC Treatments / Results  Labs (all labs ordered are listed, but only abnormal results are displayed) Labs Reviewed - No data to display  EKG   Radiology No results found.  Procedures Procedures (including critical care time)  Medications Ordered in UC Medications - No data to display  Initial Impression / Assessment and Plan / UC Course  I have reviewed the triage vital signs and the nursing notes.  Pertinent labs & imaging results that were available during my care of the patient were reviewed by me and considered in my medical decision making (see chart for details).     Patient is well-appearing, afebrile, nontoxic, nontachycardic.  Plain films were deferred as patient denies any recent trauma and had no focal bony tenderness.  She is unable to take NSAIDs we will try prednisone 20 mg for 4 days.  We discussed this can cause elevated blood sugar and so she was encouraged to avoid carbohydrates and drink plenty of fluid.  She is not to take NSAIDs with this medication.  Can use Tylenol for breakthrough pain.  She was started on baclofen 5 mg nightly.  We discussed that this can be sedating and she should not drive drink alcohol with taking it.  She is to take it just before bed in order to prevent falls.  She was also given lidocaine patches.  We discussed that ultimately she would likely need  physical therapy and/or imaging which we cannot arrange in urgent care.  She was given contact information for a sports medicine clinic and instructed to call to schedule an appointment.  Discussed that if she has any worsening or changing symptoms she needs to be seen immediately including increasing pain, fever, bowel/bladder incontinence, lower extremity weakness, saddle anesthesia.  Strict return precautions given.    Final Clinical Impressions(s) / UC Diagnoses   Final diagnoses:  Acute bilateral low back pain with bilateral sciatica     Discharge Instructions      I believe that you have some injured muscles that are putting pressure on your nerves.  Start prednisone 20 mg for 4 days.  Do not take NSAIDs with this medication including aspirin, ibuprofen/Advil, naproxen/Aleve.  Use lidocaine patch during the day.  Remove this at night.  Use only 1 patch per 24 hours.  Take baclofen at night.  This will make you sleepy so do not drive drink alcohol while taking it and take it just before you go to bed to prevent falls.  If your symptoms or not improving quickly please follow-up with sports medicine as you may benefit from imaging and/or physical therapy that we cannot arrange in urgent care.  Call them to schedule an appointment.  If anything worsens you have increasing pain, fever, nausea, vomiting, going to the bathroom yourself without noticing it, weakness in your legs, numbness or tingling in your legs you need to be seen immediately.     ED Prescriptions     Medication Sig Dispense Auth. Provider   predniSONE (DELTASONE) 20 MG tablet Take 1 tablet (20 mg total) by mouth daily for 4 days. 4 tablet ,  K, PA-C   lidocaine (LIDODERM) 5 % Place 1 patch onto the skin daily.  Remove & Discard patch within 12 hours or as directed by MD 30 patch ,  K, PA-C   Baclofen 5 MG TABS Take 1 tablet (5 mg total) by mouth at bedtime as needed. 10 tablet , Noberto Retort, PA-C       PDMP not reviewed this encounter.   Jeani Hawking, PA-C 01/06/23 1732

## 2023-01-19 DIAGNOSIS — A048 Other specified bacterial intestinal infections: Secondary | ICD-10-CM | POA: Diagnosis not present

## 2023-02-21 DIAGNOSIS — S76011A Strain of muscle, fascia and tendon of right hip, initial encounter: Secondary | ICD-10-CM | POA: Diagnosis not present

## 2023-03-03 DIAGNOSIS — N1831 Chronic kidney disease, stage 3a: Secondary | ICD-10-CM | POA: Diagnosis not present

## 2023-03-03 DIAGNOSIS — Z7409 Other reduced mobility: Secondary | ICD-10-CM | POA: Diagnosis not present

## 2023-03-03 DIAGNOSIS — R262 Difficulty in walking, not elsewhere classified: Secondary | ICD-10-CM | POA: Diagnosis not present

## 2023-03-03 DIAGNOSIS — M25559 Pain in unspecified hip: Secondary | ICD-10-CM | POA: Diagnosis not present

## 2023-03-03 DIAGNOSIS — I130 Hypertensive heart and chronic kidney disease with heart failure and stage 1 through stage 4 chronic kidney disease, or unspecified chronic kidney disease: Secondary | ICD-10-CM | POA: Diagnosis not present

## 2023-03-03 DIAGNOSIS — E1122 Type 2 diabetes mellitus with diabetic chronic kidney disease: Secondary | ICD-10-CM | POA: Diagnosis not present

## 2023-03-03 DIAGNOSIS — E1151 Type 2 diabetes mellitus with diabetic peripheral angiopathy without gangrene: Secondary | ICD-10-CM | POA: Diagnosis not present

## 2023-03-03 DIAGNOSIS — I509 Heart failure, unspecified: Secondary | ICD-10-CM | POA: Diagnosis not present

## 2023-03-03 DIAGNOSIS — Z23 Encounter for immunization: Secondary | ICD-10-CM | POA: Diagnosis not present

## 2023-03-03 DIAGNOSIS — E1169 Type 2 diabetes mellitus with other specified complication: Secondary | ICD-10-CM | POA: Diagnosis not present

## 2023-03-10 DIAGNOSIS — Z23 Encounter for immunization: Secondary | ICD-10-CM | POA: Diagnosis not present

## 2023-03-14 NOTE — Progress Notes (Unsigned)
[facility] [place]  ASSESSMENT / PLAN: Heidi Maddox is a 72 y.o. female with atherosclerosis of native arteries of bilateral lower extremities which is asymptomatic. I think her right hip discomfort is not likely to be vascular because of her normal femoral pulses.   Recommend:  Abstinence from all tobacco products. Blood glucose control with goal A1c < 7%. Blood pressure control with goal blood pressure < 140/90 mmHg. Lipid reduction therapy with goal LDL-C <100 mg/dL. Aspirin 81mg  PO QD.  Atorvastatin 40-80mg  PO QD (or other "high intensity" statin therapy).   I will see her in 12 months with repeat ABI.   CHIEF COMPLAINT: hip pain  HISTORY OF PRESENT ILLNESS: Heidi Maddox is a 72 y.o. female who presents to clinic for evaluation of severe peripheral arterial disease on non-invasive testing.  The patient reports severe, bilateral hip pain with ambulation and standing.  She has point tenderness at her greater trochanter.  She reports the symptoms have been worsening over the past several weeks, but present for some time.  She does not describe cramping discomfort in her buttock, thighs, or calves.  She does not describe severe pain in her feet.  She does describe neuropathic type discomfort which she describes as burning and tingling. She has no ulcers about her feet.  03/15/23: The patient returns for evaluation.  She continues to report right hip and groin pain.  This bothers her when at rest and with walking.  She does not have any symptoms typical of claudication, rest pain.  She has no ulcers about her feet.   Past Medical History:  Diagnosis Date   Allergy    CHF (congestive heart failure) (HCC)    Diabetes mellitus without complication (HCC)    Hyperlipidemia    Hypertension    Thyroid disease     No past surgical history on file.  No family history on file.  Social History   Socioeconomic History   Marital status: Divorced    Spouse name:  Not on file   Number of children: Not on file   Years of education: Not on file   Highest education level: Not on file  Occupational History   Not on file  Tobacco Use   Smoking status: Every Day    Current packs/day: 0.25    Types: Cigarettes   Smokeless tobacco: Never  Substance and Sexual Activity   Alcohol use: No   Drug use: Not on file   Sexual activity: Not on file  Other Topics Concern   Not on file  Social History Narrative   Not on file   Social Determinants of Health   Financial Resource Strain: Not on file  Food Insecurity: No Food Insecurity (10/30/2022)   Hunger Vital Sign    Worried About Running Out of Food in the Last Year: Never true    Ran Out of Food in the Last Year: Never true  Transportation Needs: No Transportation Needs (10/30/2022)   PRAPARE - Administrator, Civil Service (Medical): No    Lack of Transportation (Non-Medical): No  Physical Activity: Not on file  Stress: Not on file  Social Connections: Not on file  Intimate Partner Violence: Not At Risk (10/30/2022)   Humiliation, Afraid, Rape, and Kick questionnaire    Fear of Current or Ex-Partner: No    Emotionally Abused: No    Physically Abused: No    Sexually Abused: No    No Known Allergies  Current Outpatient  Medications  Medication Sig Dispense Refill   aspirin EC 81 MG tablet Take 81 mg by mouth daily.     Baclofen 5 MG TABS Take 1 tablet (5 mg total) by mouth at bedtime as needed. 10 tablet 0   buPROPion (WELLBUTRIN XL) 300 MG 24 hr tablet Take 300 mg by mouth every morning.     busPIRone (BUSPAR) 5 MG tablet Take 5 mg by mouth 2 (two) times daily.     cholecalciferol (VITAMIN D) 1000 units tablet Take 1,000 Units by mouth daily.     famotidine (PEPCID) 20 MG tablet Take 20 mg by mouth at bedtime as needed.     furosemide (LASIX) 20 MG tablet Take 20 mg by mouth daily as needed for fluid or edema.   11   levothyroxine (SYNTHROID, LEVOTHROID) 25 MCG tablet Take 25 mcg by  mouth daily.  4   lidocaine (LIDODERM) 5 % Place 1 patch onto the skin daily. Remove & Discard patch within 12 hours or as directed by MD 30 patch 0   metFORMIN (GLUCOPHAGE-XR) 500 MG 24 hr tablet Take 500 mg by mouth at bedtime.     Multiple Vitamins-Minerals (MULTIVITAMIN ADULT PO) Take 1 tablet by mouth daily.     Omega-3 Fatty Acids (FISH OIL) 1000 MG CAPS Take 1,000 mg by mouth daily.     OZEMPIC, 2 MG/DOSE, 8 MG/3ML SOPN Inject 0.5 mg into the skin once a week.     rosuvastatin (CRESTOR) 40 MG tablet Take 40 mg by mouth daily.  4   sertraline (ZOLOFT) 100 MG tablet Take 100 mg by mouth daily.  4   TRIBENZOR 40-10-25 MG TABS Take 1 tablet by mouth daily.  11   No current facility-administered medications for this visit.    PHYSICAL EXAM There were no vitals filed for this visit.   Obese elderly woman in no distress Regular rate and rhythm Unlabored breathing 2+ femoral pulses No palpable pedal pulses  PERTINENT LABORATORY AND RADIOLOGIC DATA  Most recent CBC    Latest Ref Rng & Units 06/15/2021   12:06 AM 10/27/2015    1:44 PM 06/23/2010   11:27 AM  CBC  WBC 4.0 - 10.5 K/uL 8.4  6.8    Hemoglobin 12.0 - 15.0 g/dL 09.8  11.9  14.7   Hematocrit 36.0 - 46.0 % 42.0  37.5  41.0   Platelets 150 - 400 K/uL 300  284       Most recent CMP    Latest Ref Rng & Units 06/15/2021   12:06 AM 10/27/2015    1:44 PM 06/23/2010   11:27 AM  CMP  Glucose 70 - 99 mg/dL 99  829  562   BUN 8 - 23 mg/dL 15  12    Creatinine 1.30 - 1.00 mg/dL 8.65  7.84    Sodium 696 - 145 mmol/L 138  140  144   Potassium 3.5 - 5.1 mmol/L 3.5  3.0  3.5   Chloride 98 - 111 mmol/L 106  106    CO2 22 - 32 mmol/L 23  26    Calcium 8.9 - 10.3 mg/dL 9.7  9.3    Total Protein 6.5 - 8.1 g/dL  7.3    Total Bilirubin 0.3 - 1.2 mg/dL  0.6    Alkaline Phos 38 - 126 U/L  76    AST 15 - 41 U/L  20    ALT 14 - 54 U/L  20      +-------+-----------+-----------+------------+------------+  ABI/TBIToday's ABIToday's  TBIPrevious ABIPrevious TBI  +-------+-----------+-----------+------------+------------+  Right 0.64       0.20                                 +-------+-----------+-----------+------------+------------+  Left  0.61       absent                               +-------+-----------+-----------+------------+------------+     Rande Brunt. Lenell Antu, MD FACS [facility] Phone Number: [phone] 03/14/2023 8:35 AM   Total time spent on preparing this encounter including chart review, data review, collecting history, examining the patient, coordinating care for this established patient, 30 minutes.  Portions of this report may have been transcribed using voice recognition software.  Every effort has been made to ensure accuracy; however, inadvertent computerized transcription errors may still be present.

## 2023-03-15 ENCOUNTER — Encounter: Payer: Self-pay | Admitting: Vascular Surgery

## 2023-03-15 ENCOUNTER — Ambulatory Visit (HOSPITAL_COMMUNITY)
Admission: RE | Admit: 2023-03-15 | Discharge: 2023-03-15 | Disposition: A | Discharge status: Home / Self Care | Payer: Medicare Other | Source: Ambulatory Visit | Attending: Vascular Surgery | Admitting: Vascular Surgery

## 2023-03-15 ENCOUNTER — Ambulatory Visit (INDEPENDENT_AMBULATORY_CARE_PROVIDER_SITE_OTHER): Payer: Medicare Other | Admitting: Vascular Surgery

## 2023-03-15 VITALS — BP 117/71 | HR 81 | Temp 97.7°F | Ht 65.0 in | Wt 221.0 lb

## 2023-03-15 DIAGNOSIS — I739 Peripheral vascular disease, unspecified: Secondary | ICD-10-CM

## 2023-03-17 LAB — VAS US ABI WITH/WO TBI
Left ABI: 0.61
Right ABI: 0.64

## 2023-04-05 ENCOUNTER — Other Ambulatory Visit: Payer: Self-pay

## 2023-04-05 DIAGNOSIS — I739 Peripheral vascular disease, unspecified: Secondary | ICD-10-CM

## 2023-08-16 DIAGNOSIS — R0609 Other forms of dyspnea: Secondary | ICD-10-CM | POA: Diagnosis not present

## 2023-08-16 DIAGNOSIS — L602 Onychogryphosis: Secondary | ICD-10-CM | POA: Diagnosis not present

## 2023-08-16 DIAGNOSIS — I739 Peripheral vascular disease, unspecified: Secondary | ICD-10-CM | POA: Diagnosis not present

## 2023-08-16 DIAGNOSIS — Z7409 Other reduced mobility: Secondary | ICD-10-CM | POA: Diagnosis not present

## 2023-08-16 DIAGNOSIS — M25559 Pain in unspecified hip: Secondary | ICD-10-CM | POA: Diagnosis not present

## 2023-08-16 DIAGNOSIS — I10 Essential (primary) hypertension: Secondary | ICD-10-CM | POA: Diagnosis not present

## 2023-08-16 DIAGNOSIS — N1831 Chronic kidney disease, stage 3a: Secondary | ICD-10-CM | POA: Diagnosis not present

## 2023-08-16 DIAGNOSIS — E1169 Type 2 diabetes mellitus with other specified complication: Secondary | ICD-10-CM | POA: Diagnosis not present

## 2023-08-17 ENCOUNTER — Encounter: Payer: Self-pay | Admitting: Cardiology

## 2023-09-02 DIAGNOSIS — M25551 Pain in right hip: Secondary | ICD-10-CM | POA: Diagnosis not present

## 2023-10-07 DIAGNOSIS — M1611 Unilateral primary osteoarthritis, right hip: Secondary | ICD-10-CM | POA: Diagnosis not present

## 2023-10-18 DIAGNOSIS — M1990 Unspecified osteoarthritis, unspecified site: Secondary | ICD-10-CM | POA: Diagnosis not present

## 2023-10-18 DIAGNOSIS — E1169 Type 2 diabetes mellitus with other specified complication: Secondary | ICD-10-CM | POA: Diagnosis not present

## 2023-10-18 DIAGNOSIS — R0600 Dyspnea, unspecified: Secondary | ICD-10-CM | POA: Diagnosis not present

## 2023-10-18 DIAGNOSIS — I10 Essential (primary) hypertension: Secondary | ICD-10-CM | POA: Diagnosis not present

## 2023-10-18 DIAGNOSIS — E785 Hyperlipidemia, unspecified: Secondary | ICD-10-CM | POA: Diagnosis not present

## 2023-11-14 ENCOUNTER — Ambulatory Visit: Attending: Cardiology | Admitting: Cardiology

## 2023-11-14 ENCOUNTER — Encounter: Payer: Self-pay | Admitting: Cardiology

## 2023-11-14 VITALS — BP 126/78 | HR 80 | Ht 65.5 in | Wt 217.4 lb

## 2023-11-14 DIAGNOSIS — E119 Type 2 diabetes mellitus without complications: Secondary | ICD-10-CM | POA: Insufficient documentation

## 2023-11-14 DIAGNOSIS — K219 Gastro-esophageal reflux disease without esophagitis: Secondary | ICD-10-CM | POA: Diagnosis not present

## 2023-11-14 DIAGNOSIS — M169 Osteoarthritis of hip, unspecified: Secondary | ICD-10-CM | POA: Diagnosis not present

## 2023-11-14 DIAGNOSIS — R262 Difficulty in walking, not elsewhere classified: Secondary | ICD-10-CM | POA: Diagnosis not present

## 2023-11-14 DIAGNOSIS — I10 Essential (primary) hypertension: Secondary | ICD-10-CM | POA: Diagnosis not present

## 2023-11-14 DIAGNOSIS — I739 Peripheral vascular disease, unspecified: Secondary | ICD-10-CM | POA: Insufficient documentation

## 2023-11-14 NOTE — Progress Notes (Signed)
 Cardiology Office Note:  .   Date:  11/14/2023  ID:  Heidi Maddox, DOB 1951-05-19, MRN [id number] PCP: Elliot Charm, MD  [facility] Providers Cardiologist:  Newman Lawrence, MD PCP: Elliot Charm, MD  Chief Complaint  Patient presents with   Hypertension     Heidi Maddox is a 73 y.o. female with hypertension, hyperlipidemia, type 2 DM, PAD, nicotine dependence, dyspnea on exertion  Discussed the use of AI scribe software for clinical note transcription with the patient, who gave verbal consent to proceed.  History of Present Illness Heidi Maddox is a 73 year old female who presents with hip pain. She was referred by Dr. Magda for evaluation of her hip pain and consideration of medical management.  She has hypertension, diabetes, and high cholesterol, managed with medications. She is taking Ozempic, which initially aided in weight loss, but she has noticed recent weight gain. She is in the process of quitting smoking, having not purchased a pack in two days. Her family history includes heart disease, with her mother having had similar heart issues. She denies chest pain or shortness of breath.      Vitals:   11/14/23 1006  BP: 126/78  Pulse: 80  SpO2: 97%      Review of Systems  Cardiovascular:  Negative for chest pain, dyspnea on exertion, leg swelling, palpitations and syncope.        Studies Reviewed: Heidi Maddox        EKG 11/14/2023: Normal sinus rhythm Nonspecific ST and T wave abnormality Prolonged QT When compared with ECG of 15-Jun-2021 01:46,  PVC absent    Labs 07/2023: Chol 136, TG 117, HDL 44, LDL 71 HbA1C 5.8% Hb 13.1 Cr 1.2 TSH 0.4  Vascular ultrasound 02/2023: Right: Resting right ankle-brachial index indicates moderate right lower  extremity arterial disease. (ABI 0.64). The right toe-brachial index is abnormal (0.20).  Left: Resting left ankle-brachial index indicates moderate left lower  extremity arterial  disease. (ABI 0.61, TBI absent)   Physical Exam Vitals and nursing note reviewed.  Constitutional:      General: She is not in acute distress. Neck:     Vascular: No JVD.   Cardiovascular:     Rate and Rhythm: Normal rate and regular rhythm.     Heart sounds: Normal heart sounds. No murmur heard. Pulmonary:     Effort: Pulmonary effort is normal.     Breath sounds: Normal breath sounds. No wheezing or rales.   Musculoskeletal:     Right lower leg: No edema.     Left lower leg: No edema.      VISIT DIAGNOSES:   ICD-10-CM   1. Primary hypertension  I10 EKG 12-Lead    ECHOCARDIOGRAM COMPLETE    2. Peripheral arterial disease (HCC)  I73.9     3. Type 2 diabetes mellitus without complication, without long-term current use of insulin (HCC)  E11.9        Heidi Maddox is a 73 y.o. female with hypertension, hyperlipidemia, type 2 DM, PAD, nicotine dependence, dyspnea on exertion Assessment & Plan  Hypertension: Well-controlled with current medications. Blood pressure normal. - Continue current antihypertensive medications. - Check echocardiogram.  Mixed hyperlipidemia: Well-controlled with current medications. - Continue current lipid-lowering medications.  Type 2 diabetes mellitus: Managed with Ozempic. Some weight loss noted. Echocardiogram recommended due to hypertension and diabetes. - Continue Ozempic. - Order echocardiogram.  Tobacco use: Actively working on cessation. Not smoked in two days. Previously smoked a pack every  three weeks. - Continue smoking cessation efforts.  Hip pain: Chronic hip pain managed with meloxicam and acetaminophen . Daily meloxicam ineffective, used as needed. Pain management prioritized. - Tylenol  preferred given ongoing use of aspirin for PAD  F/u as needed  Signed, Newman [name] Lawrence, MD

## 2023-11-14 NOTE — Patient Instructions (Signed)
 Medication Instructions:  Your physician recommends that you continue on your current medications as directed. Please refer to the Current Medication list given to you today.  *If you need a refill on your cardiac medications before your next appointment, please call your pharmacy*  Lab Work: If you have labs (blood work) drawn today and your tests are completely normal, you will receive your results only by: MyChart Message (if you have MyChart) OR A paper copy in the mail If you have any lab test that is abnormal or we need to change your treatment, we will call you to review the results.  Testing/Procedures: Your physician has requested that you have an echocardiogram. Echocardiography is a painless test that uses sound waves to create images of your heart. It provides your doctor with information about the size and shape of your heart and how well your heart's chambers and valves are working. This procedure takes approximately one hour. There are no restrictions for this procedure. Please do NOT wear cologne, perfume, aftershave, or lotions (deodorant is allowed). Please arrive 15 minutes prior to your appointment time.  Please note: We ask at that you not bring children with you during ultrasound (echo/ vascular) testing. Due to room size and safety concerns, children are not allowed in the ultrasound rooms during exams. Our front office staff cannot provide observation of children in our lobby area while testing is being conducted. An adult accompanying a patient to their appointment will only be allowed in the ultrasound room at the discretion of the ultrasound technician under special circumstances. We apologize for any inconvenience. Follow-Up: At [facility], you and your health needs are our priority.  As part of our continuing mission to provide you with exceptional heart care, our providers are all part of one team.  This team includes your primary Cardiologist (physician)  and Advanced Practice Providers or APPs (Physician Assistants and Nurse Practitioners) who all work together to provide you with the care you need, when you need it.  Your next appointment:   As needed  Provider:   Newman [name] Lawrence, MD    We recommend signing up for the patient portal called MyChart.  Sign up information is provided on this After Visit Summary.  MyChart is used to connect with patients for Virtual Visits (Telemedicine).  Patients are able to view lab/test results, encounter notes, upcoming appointments, etc.  Non-urgent messages can be sent to your provider as well.   To learn more about what you can do with MyChart, go to [email or web address].   Other Instructions Diet & Lifestyle recommendations:  Physical activity recommendation (The Physical Activity Guidelines for Americans. JAMA 2018;Nov 12) At least 150-300 minutes a week of moderate-intensity, or 75-150 minutes a week of vigorous-intensity aerobic physical activity, or an equivalent combination of moderate- and vigorous-intensity aerobic activity. Adults should perform muscle-strengthening activities on 2 or more days a week. Older adults should do multicomponent physical activity that includes balance training as well as aerobic and muscle-strengthening activities. Benefits of increased physical activity include lower risk of mortality including cardiovascular mortality, lower risk of cardiovascular events and associated risk factors (hypertension and diabetes), and lower risk of many cancers (including bladder, breast, colon, endometrium, esophagus, kidney, lung, and stomach). Additional improvments have been seen in cognition, risk of dementia, anxiety and depression, improved bone health, lower risk of falls, and associated injuries.  Dietary recommendation The 2019 ACC/AHA guidelines promote nutrition as a main fixture of cardiovascular wellness, with a recommendation for a varied  diet of fruit, vegetables,  fish, legumes, and whole grains (Class I), as well as recommendations to reduce sodium, cholesterol, processed meats, and refined sugars (Class IIa recommendation).10 Sodium intake, a topic of some controversy as of late, is recommended to be kept at 1,500 mg/day or less, far below the average daily intake in the [place]  of 3,409 mg/day, and notably below that of previous US  recommendations for 300mg /day.10,11 For those unable to reach 1,500 mg/day, they recommend at least a reduction of 1000 mg/day.  A Pesco-Mediterranean Diet With Intermittent Fasting: JACC Review Topic of the Week. J Am Coll Cardiol 2020;76:1484-1493 Pesco-Mediterranean diet, it is supplemented with extra-virgin olive oil (EVOO), which is the principle fat source, along with moderate amounts of dairy (particularly yogurt and cheese) and eggs, as well as modest amounts of alcohol consumption (ideally red wine with the evening meal), but few red and processed meats.

## 2023-11-21 DIAGNOSIS — E1151 Type 2 diabetes mellitus with diabetic peripheral angiopathy without gangrene: Secondary | ICD-10-CM | POA: Diagnosis not present

## 2023-11-21 DIAGNOSIS — I509 Heart failure, unspecified: Secondary | ICD-10-CM | POA: Diagnosis not present

## 2023-12-22 DIAGNOSIS — E1151 Type 2 diabetes mellitus with diabetic peripheral angiopathy without gangrene: Secondary | ICD-10-CM | POA: Diagnosis not present

## 2023-12-22 DIAGNOSIS — I509 Heart failure, unspecified: Secondary | ICD-10-CM | POA: Diagnosis not present

## 2023-12-23 ENCOUNTER — Telehealth: Payer: Self-pay

## 2023-12-23 DIAGNOSIS — Z720 Tobacco use: Secondary | ICD-10-CM | POA: Diagnosis not present

## 2023-12-23 DIAGNOSIS — Z1231 Encounter for screening mammogram for malignant neoplasm of breast: Secondary | ICD-10-CM | POA: Diagnosis not present

## 2023-12-23 DIAGNOSIS — Z136 Encounter for screening for cardiovascular disorders: Secondary | ICD-10-CM | POA: Diagnosis not present

## 2023-12-23 DIAGNOSIS — F17210 Nicotine dependence, cigarettes, uncomplicated: Secondary | ICD-10-CM | POA: Diagnosis not present

## 2023-12-23 DIAGNOSIS — I10 Essential (primary) hypertension: Secondary | ICD-10-CM | POA: Diagnosis not present

## 2023-12-23 DIAGNOSIS — M169 Osteoarthritis of hip, unspecified: Secondary | ICD-10-CM | POA: Diagnosis not present

## 2023-12-23 DIAGNOSIS — Z0000 Encounter for general adult medical examination without abnormal findings: Secondary | ICD-10-CM | POA: Diagnosis not present

## 2023-12-23 DIAGNOSIS — E1169 Type 2 diabetes mellitus with other specified complication: Secondary | ICD-10-CM | POA: Diagnosis not present

## 2023-12-23 DIAGNOSIS — I6529 Occlusion and stenosis of unspecified carotid artery: Secondary | ICD-10-CM | POA: Diagnosis not present

## 2023-12-23 DIAGNOSIS — Z7409 Other reduced mobility: Secondary | ICD-10-CM | POA: Diagnosis not present

## 2023-12-23 NOTE — Telephone Encounter (Signed)
 Will await echocardiogram on 8/6.  Thanks MJP

## 2023-12-23 NOTE — Telephone Encounter (Signed)
   Pre-operative Risk Assessment    Patient Name: Heidi Maddox  DOB: December 01, 1950 MRN: [id number]   Date of last office visit: 11/14/23 [facility], MD Date of next office visit: NONE   Request for Surgical Clearance    Procedure:  RIGHT TOTAL HIP ARTHROPLASTY  Date of Surgery:  Clearance 01/17/24                                Surgeon:  NOT INDICATED Surgeon's Group or Practice Name:  [facility] Phone number:  [phone] Fax number:  [phone]   Type of Clearance Requested:   - Medical  - Pharmacy:  Hold Aspirin     Type of Anesthesia:  Spinal   Additional requests/questions:    SignedLucie DELENA Ku   12/23/2023, 1:39 PM

## 2023-12-26 NOTE — Telephone Encounter (Signed)
 Pt updated with Dr [name] recommendations.

## 2023-12-28 ENCOUNTER — Ambulatory Visit (HOSPITAL_COMMUNITY)
Admission: RE | Admit: 2023-12-28 | Discharge: 2023-12-28 | Disposition: A | Discharge status: Home / Self Care | Source: Ambulatory Visit | Attending: Cardiovascular Disease | Admitting: Cardiovascular Disease

## 2023-12-28 DIAGNOSIS — I10 Essential (primary) hypertension: Secondary | ICD-10-CM | POA: Insufficient documentation

## 2023-12-28 LAB — ECHOCARDIOGRAM COMPLETE
AR max vel: 2.27 cm2
AV Area VTI: 2.42 cm2
AV Area mean vel: 2.38 cm2
AV Mean grad: 5 mmHg
AV Peak grad: 8.8 mmHg
Ao pk vel: 1.48 m/s
S' Lateral: 2.78 cm

## 2024-01-01 ENCOUNTER — Ambulatory Visit: Payer: Self-pay | Admitting: Cardiology

## 2024-01-02 NOTE — Progress Notes (Addendum)
 Anesthesia Review:  PCP: Varadarajan with [facility] 10/19/23 in Media Tab  Cardiologist :Patwardhan LOV 11/14/23   PPM/ ICD: Device Orders: Rep Notified:  Chest x-ray : EKG : 11/14/23  Echo :12/28/23  Stress test: Cardiac Cath :   Activity level: can do a flight of stairs without difficulty  Sleep Study/ CPAP : none  Fasting Blood Sugar :      / Checks Blood Sugar -- times a day:     DM- type 2-checks glucose dialy in am  Hgba1c-  01/05/24- 5.8 Metformin- none am of surgeyr  Ozempic- last dose on 12/28/23 per pt   Blood Thinner/ Instructions Cherre Dose: ASA / Instructions/ Last Dose :    01/05/24- 0800am - PT was driving on [facility] when called.  Asked med hx questions over phone.  PT thenpulled into hospital and I had to instruct pt to use valet parking and to come thru [employer].     PT at preop on 01/05/24 unsure of medications and strength she was given to take am of surgery per MD for her anxiety.  PT to call preop nurse back with name of med and strength.  .  Phone number given. Pt called back and stated she was given script for alrpazolam. Called pt back on 01/05/24 and LVMM for pt stating she could take that med am of surgery.  And for pt to give preop nurse a call back stating she ahd received the messagel.

## 2024-01-02 NOTE — Patient Instructions (Addendum)
 SURGICAL WAITING ROOM VISITATION  Patients having surgery or a procedure may have no more than 2 support people in the waiting area - these visitors may rotate.    Children under the age of 60 must have an adult with them who is not the patient.  Visitors with respiratory illnesses are discouraged from visiting and should remain at home.  If the patient needs to stay at the hospital during part of their recovery, the visitor guidelines for inpatient rooms apply. Pre-op nurse will coordinate an appropriate time for 1 support person to accompany patient in pre-op.  This support person may not rotate.    Please refer to the [facility] website for the visitor guidelines for Inpatients (after your surgery is over and you are in a regular room).       Your procedure is scheduled on:  01/17/24    Report to [facility] Main Entrance    Report to admitting at  0830 AM   Call this number if you have problems the morning of surgery [phone]   Do not eat food :After Midnight.   After Midnight you may have the following liquids until _ 0800_____ AM  DAY OF SURGERY  Water Non-Citrus Juices (without pulp, NO RED-Apple, White grape, White cranberry) Black Coffee (NO MILK/CREAM OR CREAMERS, sugar ok)  Clear Tea (NO MILK/CREAM OR CREAMERS, sugar ok) regular and decaf                             Plain Jell-O (NO RED)                                           Fruit ices (not with fruit pulp, NO RED)                                     Popsicles (NO RED)                                                               Sports drinks like Gatorade (NO RED)                    The day of surgery:  Drink ONE (1) Pre-Surgery Clear Ensure or G2 at  0800AM the morning of surgery. Drink in one sitting. Do not sip.  This drink was given to you during your hospital  pre-op appointment visit. Nothing else to drink after completing the  Pre-Surgery Clear Ensure or G2.          If you have  questions, please contact your surgeon's office.       Oral Hygiene is also important to reduce your risk of infection.                                    Remember - BRUSH YOUR TEETH THE MORNING OF SURGERY WITH YOUR REGULAR TOOTHPASTE  DENTURES WILL BE REMOVED PRIOR TO SURGERY PLEASE DO NOT APPLY Poly grip OR  ADHESIVES!!!   Do NOT smoke after Midnight   Stop all vitamins and herbal supplements 7 days before surgery.   Take these medicines the morning of surgery with A SIP OF WATER:  wellbutrin, buspasr, synthroid, zoloft,              Ozempic- last dose on            Metformin- none am of surgery   DO NOT TAKE ANY ORAL DIABETIC MEDICATIONS DAY OF YOUR SURGERY  Bring CPAP mask and tubing day of surgery.                              You may not have any metal on your body including hair pins, jewelry, and body piercing             Do not wear make-up, lotions, powders, perfumes/cologne, or deodorant  Do not wear nail polish including gel and S&S, artificial/acrylic nails, or any other type of covering on natural nails including finger and toenails. If you have artificial nails, gel coating, etc. that needs to be removed by a nail salon please have this removed prior to surgery or surgery may need to be canceled/ delayed if the surgeon/ anesthesia feels like they are unable to be safely monitored.   Do not shave  48 hours prior to surgery.               Men may shave face and neck.   Do not bring valuables to the hospital. [facility] IS NOT             RESPONSIBLE   FOR VALUABLES.   Contacts, glasses, dentures or bridgework may not be worn into surgery.   Bring small overnight bag day of surgery.   DO NOT BRING YOUR HOME MEDICATIONS TO THE HOSPITAL. PHARMACY WILL DISPENSE MEDICATIONS LISTED ON YOUR MEDICATION LIST TO YOU DURING YOUR ADMISSION IN THE HOSPITAL!    Patients discharged on the day of surgery will not be allowed to drive home.  Someone NEEDS to stay with you for  the first 24 hours after anesthesia.   Special Instructions: Bring a copy of your healthcare power of attorney and living will documents the day of surgery if you haven't scanned them before.              Please read over the following fact sheets you were given: IF YOU HAVE QUESTIONS ABOUT YOUR PRE-OP INSTRUCTIONS PLEASE CALL [phone].   If you received a COVID test during your pre-op visit  it is requested that you wear a mask when out in public, stay away from anyone that may not be feeling well and notify your surgeon if you develop symptoms. If you test positive for Covid or have been in contact with anyone that has tested positive in the last 10 days please notify you surgeon.      Pre-operative 5 CHG Bath Instructions   You can play a key role in reducing the risk of infection after surgery. Your skin needs to be as free of germs as possible. You can reduce the number of germs on your skin by washing with CHG (chlorhexidine  gluconate) soap before surgery. CHG is an antiseptic soap that kills germs and continues to kill germs even after washing.   DO NOT use if you have an allergy to chlorhexidine /CHG or antibacterial soaps. If your skin becomes reddened or irritated, stop using the  CHG and notify one of our RNs at [phone].   Please shower with the CHG soap starting 4 days before surgery using the following schedule:     Please keep in mind the following:  DO NOT shave, including legs and underarms, starting the day of your first shower.   You may shave your face at any point before/day of surgery.  Place clean sheets on your bed the day you start using CHG soap. Use a clean washcloth (not used since being washed) for each shower. DO NOT sleep with pets once you start using the CHG.   CHG Shower Instructions:  If you choose to wash your hair and private area, wash first with your normal shampoo/soap.  After you use shampoo/soap, rinse your hair and body thoroughly to remove  shampoo/soap residue.  Turn the water OFF and apply about 3 tablespoons (45 ml) of CHG soap to a CLEAN washcloth.  Apply CHG soap ONLY FROM YOUR NECK DOWN TO YOUR TOES (washing for 3-5 minutes)  DO NOT use CHG soap on face, private areas, open wounds, or sores.  Pay special attention to the area where your surgery is being performed.  If you are having back surgery, having someone wash your back for you may be helpful. Wait 2 minutes after CHG soap is applied, then you may rinse off the CHG soap.  Pat dry with a clean towel  Put on clean clothes/pajamas   If you choose to wear lotion, please use ONLY the CHG-compatible lotions on the back of this paper.     Additional instructions for the day of surgery: DO NOT APPLY any lotions, deodorants, cologne, or perfumes.   Put on clean/comfortable clothes.  Brush your teeth.  Ask your nurse before applying any prescription medications to the skin.      CHG Compatible Lotions   Aveeno Moisturizing lotion  Cetaphil Moisturizing Cream  Cetaphil Moisturizing Lotion  Clairol Herbal Essence Moisturizing Lotion, Dry Skin  Clairol Herbal Essence Moisturizing Lotion, Extra Dry Skin  Clairol Herbal Essence Moisturizing Lotion, Normal Skin  Curel Age Defying Therapeutic Moisturizing Lotion with Alpha Hydroxy  Curel Extreme Care Body Lotion  Curel Soothing Hands Moisturizing Hand Lotion  Curel Therapeutic Moisturizing Cream, Fragrance-Free  Curel Therapeutic Moisturizing Lotion, Fragrance-Free  Curel Therapeutic Moisturizing Lotion, Original Formula  Eucerin Daily Replenishing Lotion  Eucerin Dry Skin Therapy Plus Alpha Hydroxy Crme  Eucerin Dry Skin Therapy Plus Alpha Hydroxy Lotion  Eucerin Original Crme  Eucerin Original Lotion  Eucerin Plus Crme Eucerin Plus Lotion  Eucerin TriLipid Replenishing Lotion  Keri Anti-Bacterial Hand Lotion  Keri Deep Conditioning Original Lotion Dry Skin Formula Softly Scented  Keri Deep Conditioning  Original Lotion, Fragrance Free Sensitive Skin Formula  Keri Lotion Fast Absorbing Fragrance Free Sensitive Skin Formula  Keri Lotion Fast Absorbing Softly Scented Dry Skin Formula  Keri Original Lotion  Keri Skin Renewal Lotion Keri Silky Smooth Lotion  Keri Silky Smooth Sensitive Skin Lotion  Nivea Body Creamy Conditioning Oil  Nivea Body Extra Enriched Teacher, adult education Moisturizing Lotion Nivea Crme  Nivea Skin Firming Lotion  NutraDerm 30 Skin Lotion  NutraDerm Skin Lotion  NutraDerm Therapeutic Skin Cream  NutraDerm Therapeutic Skin Lotion  ProShield Protective Hand Cream  Provon moisturizing lotion

## 2024-01-04 NOTE — Telephone Encounter (Signed)
 Reassuring echocardiogram.  Low suspicion for obstructive CAD.  Okay to proceed with low cardiac risk.  Thanks MJP

## 2024-01-04 NOTE — Telephone Encounter (Signed)
 Dr. [name],  You saw this patient on 11/14/2023. Per protocol we request that you comment on his cardiac risk to proceed with RIGHT TOTAL HIP ARTHROPLASTY scheduled for 01/17/2024. You wanted an echo to be completed first. Echo was completed on 12/28/2023 with normal EF and no severe valvular disease. Since it has been less than 2 months since evaluated in the office and echo was completed,is she okay to proceed?Please send your comment to P CV Pre-Op Pool.  Thank you, Lamarr Satterfield DNP, ANP, AACC.

## 2024-01-05 ENCOUNTER — Encounter (HOSPITAL_COMMUNITY)
Admission: RE | Admit: 2024-01-05 | Discharge: 2024-01-05 | Disposition: A | Discharge status: Home / Self Care | Source: Ambulatory Visit | Attending: Orthopedic Surgery | Admitting: Orthopedic Surgery

## 2024-01-05 ENCOUNTER — Other Ambulatory Visit: Payer: Self-pay

## 2024-01-05 ENCOUNTER — Encounter (HOSPITAL_COMMUNITY): Payer: Self-pay

## 2024-01-05 VITALS — BP 153/72 | HR 70 | Temp 98.0°F | Resp 16 | Ht 65.5 in

## 2024-01-05 DIAGNOSIS — M1611 Unilateral primary osteoarthritis, right hip: Secondary | ICD-10-CM | POA: Insufficient documentation

## 2024-01-05 DIAGNOSIS — Z01818 Encounter for other preprocedural examination: Secondary | ICD-10-CM | POA: Diagnosis not present

## 2024-01-05 DIAGNOSIS — E119 Type 2 diabetes mellitus without complications: Secondary | ICD-10-CM | POA: Insufficient documentation

## 2024-01-05 HISTORY — DX: Personal history of urinary calculi: Z87.442

## 2024-01-05 HISTORY — DX: Pneumonia, unspecified organism: J18.9

## 2024-01-05 HISTORY — DX: Other specified postprocedural states: Z98.890

## 2024-01-05 HISTORY — DX: Nausea with vomiting, unspecified: R11.2

## 2024-01-05 LAB — CBC
HCT: 40.5 % (ref 36.0–46.0)
Hemoglobin: 12.9 g/dL (ref 12.0–15.0)
MCH: 27.8 pg (ref 26.0–34.0)
MCHC: 31.9 g/dL (ref 30.0–36.0)
MCV: 87.3 fL (ref 80.0–100.0)
Platelets: 228 K/uL (ref 150–400)
RBC: 4.64 MIL/uL (ref 3.87–5.11)
RDW: 13.8 % (ref 11.5–15.5)
WBC: 4.7 K/uL (ref 4.0–10.5)
nRBC: 0 % (ref 0.0–0.2)

## 2024-01-05 LAB — BASIC METABOLIC PANEL WITH GFR
Anion gap: 7 (ref 5–15)
BUN: 18 mg/dL (ref 8–23)
CO2: 25 mmol/L (ref 22–32)
Calcium: 9.5 mg/dL (ref 8.9–10.3)
Chloride: 107 mmol/L (ref 98–111)
Creatinine, Ser: 0.91 mg/dL (ref 0.44–1.00)
GFR, Estimated: >60 mL/min (ref >60)
Glucose, Bld: 96 mg/dL (ref 70–99)
Potassium: 3.9 mmol/L (ref 3.5–5.1)
Sodium: 139 mmol/L (ref 135–145)

## 2024-01-05 LAB — HEMOGLOBIN A1C
Hgb A1c MFr Bld: 5.8 % — ABNORMAL HIGH (ref 4.8–5.6)
Mean Plasma Glucose: 120 mg/dL

## 2024-01-05 LAB — GLUCOSE, CAPILLARY: Glucose-Capillary: 97 mg/dL (ref 70–99)

## 2024-01-05 LAB — SURGICAL PCR SCREEN
MRSA, PCR: NEGATIVE
Staphylococcus aureus: NEGATIVE

## 2024-01-05 LAB — TYPE AND SCREEN
ABO/RH(D): B POS
Antibody Screen: NEGATIVE

## 2024-01-05 NOTE — Telephone Encounter (Signed)
   Patient Name: Heidi Maddox  DOB: November 26, 1950 MRN: [id number]  Primary Cardiologist: Newman [name] Lawrence, MD  Chart reviewed as part of pre-operative protocol coverage. Given past medical history and time since last visit, based on ACC/AHA guidelines, Heidi Maddox is at acceptable risk for the planned procedure without further cardiovascular testing.   Per Dr. Lawrence, Reassuring echocardiogram.  Low suspicion for obstructive CAD.  Okay to proceed with low cardiac risk.   Thanks MJP   Regarding ASA therapy, we recommend continuation of ASA throughout the perioperative period.  However, if the surgeon feels that cessation of ASA is required in the perioperative period, it may be stopped 5-7 days prior to surgery with a plan to resume it as soon as felt to be feasible from a surgical standpoint in the post-operative period.  I will route this recommendation to the requesting party via Epic fax function and remove from pre-op pool.  Please call with questions.  Heidi JAYSON Braver, NP 01/05/2024, 11:31 AM

## 2024-01-06 ENCOUNTER — Other Ambulatory Visit: Payer: Self-pay | Admitting: Internal Medicine

## 2024-01-06 DIAGNOSIS — Z1231 Encounter for screening mammogram for malignant neoplasm of breast: Secondary | ICD-10-CM

## 2024-01-10 NOTE — H&P (Signed)
 TOTAL HIP ADMISSION H&P  Patient is admitted for right total hip arthroplasty.  Therapy Plans: HEP Disposition: Home with daughter (to patient's house) Planned DVT Prophylaxis: aspirin 81mg  BID DME needed: walker PCP: Dr. Elliot - clearance received Cardio: Dr. [name] - had echo, Apt on 8/14 TXA: IV Allergies: NKDA Anesthesia Concerns: none BMI: 36.8 Last HgbA1c: pre-diabetic   Other: - No hx of VTE or cancer - tramadol/oxycodone , robaxin, tylenol , celebrex - staying overnight - Daughter is a nurse   Subjective:  Chief Complaint: right hip pain  HPI: Heidi Maddox, 73 y.o. female, has a history of pain and functional disability in the right hip(s) due to arthritis and patient has failed non-surgical conservative treatments for greater than 12 weeks to include NSAID's and/or analgesics and activity modification.  Onset of symptoms was gradual starting 2 years ago with gradually worsening course since that time.The patient noted no past surgery on the right hip(s).  Patient currently rates pain in the right hip at 8 out of 10 with activity. Patient has worsening of pain with activity and weight bearing, pain that interfers with activities of daily living, and pain with passive range of motion. Patient has evidence of joint space narrowing by imaging studies. This condition presents safety issues increasing the risk of falls.   There is no current active infection.  Patient Active Problem List   Diagnosis Date Noted   Primary hypertension 11/14/2023   Type 2 diabetes mellitus without complication, without long-term current use of insulin (HCC) 11/14/2023   Peripheral arterial disease (HCC) 11/14/2023   Past Medical History:  Diagnosis Date   Allergic rhinitis    Allergy    Carotid stenosis    CHF (congestive heart failure) (HCC)    Chronic kidney disease (CKD), stage III (moderate) (HCC)    Constipation    Diabetes mellitus without complication (HCC)    Dyslipidemia     Dyspnea on exertion    Esophageal reflux    GAD (generalized anxiety disorder)    GERD (gastroesophageal reflux disease)    Hip pain    History of kidney stones    Hyperlipidemia    Hypertension    Hypothyroidism    Morbid obesity with alveolar hypoventilation (HCC)    Onychogryposis of toenail    Osteoarthritis of knee    Peripheral arterial disease (HCC)    Pneumonia    PONV (postoperative nausea and vomiting)    Scald burn    Sleep disturbances    Thyroid  disease    Tobacco use    Venous insufficiency     No past surgical history on file.  No current facility-administered medications for this encounter.   Current Outpatient Medications  Medication Sig Dispense Refill Last Dose/Taking   acetaminophen  (TYLENOL ) 500 MG tablet Take 500-1,000 mg by mouth every 6 (six) hours as needed (pain.).   Taking As Needed   aspirin EC 81 MG tablet Take 81 mg by mouth daily in the afternoon.   Taking   busPIRone (BUSPAR) 5 MG tablet Take 5 mg by mouth 2 (two) times daily.   Taking   carboxymethylcellul-glycerin (LUBRICANT DROPS/DUAL-ACTION) 0.5-0.9 % ophthalmic solution Place 1-2 drops into both eyes 3 (three) times daily as needed (dry/irritated eyes.).   Taking As Needed   famotidine (PEPCID) 20 MG tablet Take 20 mg by mouth daily as needed for heartburn or indigestion.   Taking As Needed   furosemide (LASIX) 20 MG tablet Take 20 mg by mouth daily as needed for fluid  or edema.   11 Taking As Needed   levothyroxine (SYNTHROID, LEVOTHROID) 25 MCG tablet Take 25 mcg by mouth daily before breakfast.  4 Taking   meloxicam (MOBIC) 15 MG tablet Take 15 mg by mouth as needed for pain.   Taking As Needed   metFORMIN (GLUCOPHAGE-XR) 500 MG 24 hr tablet Take 500 mg by mouth at bedtime.   Taking   Olmesartan-amLODIPine-HCTZ 40-10-25 MG TABS Take 1 tablet by mouth daily before supper.   Taking   omeprazole (PRILOSEC) 40 MG capsule Take 40 mg by mouth daily before supper.   Taking   rosuvastatin  (CRESTOR) 40 MG tablet Take 40 mg by mouth daily with lunch.  4 Taking   ALPRAZolam (XANAX) 0.5 MG tablet Take 0.5 mg by mouth at bedtime as needed for anxiety. As needed      OZEMPIC, 2 MG/DOSE, 8 MG/3ML SOPN Inject 1 mg into the skin every Wednesday.      No Known Allergies  Social History   Tobacco Use   Smoking status: Every Day    Current packs/day: 0.25    Types: Cigarettes   Smokeless tobacco: Never  Substance Use Topics   Alcohol use: No    No family history on file.   Review of Systems  Constitutional:  Negative for chills and fever.  Respiratory:  Negative for cough and shortness of breath.   Cardiovascular:  Negative for chest pain.  Gastrointestinal:  Negative for nausea and vomiting.  Musculoskeletal:  Positive for arthralgias.     Objective:  Physical Exam Well nourished and well developed. General: Alert and oriented x3, cooperative and pleasant, no acute distress.  Musculoskeletal: Right hip exam: Painful and limited hip flexion internal rotation over 5 degrees with pelvic tilting, external rotation close of 30 degrees Mild external rotation contracture due to pain with active hip flexion She does not have significant lower extremity edema in either lower extremity today without erythema  Vital signs in last 24 hours:    Labs:   Estimated body mass index is 35.63 kg/m as calculated from the following:   Height as of 01/05/24: 5' 5.5 (1.664 m).   Weight as of 11/14/23: 98.6 kg.   Imaging Review Plain radiographs demonstrate severe degenerative joint disease of the right hip(s). The bone quality appears to be adequate for age and reported activity level.      Assessment/Plan:  End stage arthritis, right hip(s)  The patient history, physical examination, clinical judgement of the provider and imaging studies are consistent with end stage degenerative joint disease of the right hip(s) and total hip arthroplasty is deemed medically necessary. The  treatment options including medical management, injection therapy, arthroscopy and arthroplasty were discussed at length. The risks and benefits of total hip arthroplasty were presented and reviewed. The risks due to aseptic loosening, infection, stiffness, dislocation/subluxation,  thromboembolic complications and other imponderables were discussed.  The patient acknowledged the explanation, agreed to proceed with the plan and consent was signed. Patient is being admitted for inpatient treatment for surgery, pain control, PT, OT, prophylactic antibiotics, VTE prophylaxis, progressive ambulation and ADL's and discharge planning.The patient is planning to be discharged home.    Patient's anticipated LOS is less than 2 midnights, meeting these requirements: - Younger than 14 - Lives within 1 hour of care - Has a competent adult at home to recover with post-op recover - NO history of  - Chronic pain requiring opiods  - Diabetes  - Coronary Artery Disease  - Heart  failure  - Heart attack  - Stroke  - DVT/VTE  - Cardiac arrhythmia  - Respiratory Failure/COPD  - Renal failure  - Anemia  - Advanced Liver disease  Rosina Calin, PA-C Orthopedic Surgery [facility] [place] [phone]

## 2024-01-10 NOTE — Progress Notes (Signed)
 Anesthesia Chart Review   Case: [id number] Date/Time: 01/17/24 1045   Procedure: ARTHROPLASTY, HIP, TOTAL, ANTERIOR APPROACH (Right: Hip)   Anesthesia type: Spinal   Diagnosis: Primary osteoarthritis of right hip [M16.11]   Pre-op diagnosis: Right hip osteoarthritis   Location: [facility] ROOM 10 / [facility]   Surgeons: Ernie Cough, MD       DISCUSSION:73 y.o. smoker with h/o PONV, hypothyroidism, HTN, CHF, DM II (A1C 5.8), CKD Stage III, right hip OA scheduled for above procedure 01/17/2024 with Dr. Cough Ernie.   Per cardiology preoperative evaluation 01/05/2024, Chart reviewed as part of pre-operative protocol coverage. Given past medical history and time since last visit, based on ACC/AHA guidelines, Heidi Maddox is at acceptable risk for the planned procedure without further cardiovascular testing.    Per Dr. [name], Reassuring echocardiogram.  Low suspicion for obstructive CAD.  Okay to proceed with low cardiac risk.   Thanks MJP   Regarding ASA therapy, we recommend continuation of ASA throughout the perioperative period.  However, if the surgeon feels that cessation of ASA is required in the perioperative period, it may be stopped 5-7 days prior to surgery with a plan to resume it as soon as felt to be feasible from a surgical standpoint in the post-operative period.  Pt advised to hold Ozempic 1 week prior to surgery.  VS: BP (!) 153/72   Pulse 70   Temp 36.7 C (Oral)   Resp 16   Ht 5' 5.5 (1.664 m)   SpO2 100%   BMI 35.63 kg/m   PROVIDERS: Heidi Charm, MD is PCP    Primary Cardiologist: Heidi [name] [name], MD LABS: Labs reviewed: Acceptable for surgery. (all labs ordered are listed, but only abnormal results are displayed)  Labs Reviewed  HEMOGLOBIN A1C - Abnormal; Notable for the following components:      Result Value   Hgb A1c MFr Bld 5.8 (*)    All other components within normal limits  SURGICAL PCR SCREEN  BASIC METABOLIC PANEL WITH GFR  CBC   GLUCOSE, CAPILLARY  TYPE AND SCREEN     IMAGES:   EKG:   CV: Echo 12/28/2023 1. Left ventricular ejection fraction, by estimation, is 60 to 65%. The  left ventricle has normal function. The left ventricle has no regional  wall motion abnormalities. There is mild concentric left ventricular  hypertrophy. Left ventricular diastolic  parameters are consistent with Grade I diastolic dysfunction (impaired  relaxation).   2. Right ventricular systolic function is normal. The right ventricular  size is normal.   3. The mitral valve is normal in structure. No evidence of mitral valve  regurgitation. No evidence of mitral stenosis.   4. The aortic valve is calcified. There is mild calcification of the  aortic valve. Aortic valve regurgitation is not visualized. Aortic valve  sclerosis/calcification is present, without any evidence of aortic  stenosis.   5. The inferior vena cava is normal in size with greater than 50%  respiratory variability, suggesting right atrial pressure of 3 mmHg.  Past Medical History:  Diagnosis Date   Allergic rhinitis    Allergy    Carotid stenosis    CHF (congestive heart failure) (HCC)    Chronic kidney disease (CKD), stage III (moderate) (HCC)    Constipation    Diabetes mellitus without complication (HCC)    Dyslipidemia    Dyspnea on exertion    Esophageal reflux    GAD (generalized anxiety disorder)    GERD (gastroesophageal reflux disease)  Hip pain    History of kidney stones    Hyperlipidemia    Hypertension    Hypothyroidism    Morbid obesity with alveolar hypoventilation (HCC)    Onychogryposis of toenail    Osteoarthritis of knee    Peripheral arterial disease (HCC)    Pneumonia    PONV (postoperative nausea and vomiting)    Scald burn    Sleep disturbances    Thyroid  disease    Tobacco use    Venous insufficiency     History reviewed. No pertinent surgical history.  MEDICATIONS:  ALPRAZolam (XANAX) 0.5 MG tablet    acetaminophen  (TYLENOL ) 500 MG tablet   aspirin EC 81 MG tablet   busPIRone (BUSPAR) 5 MG tablet   carboxymethylcellul-glycerin (LUBRICANT DROPS/DUAL-ACTION) 0.5-0.9 % ophthalmic solution   famotidine (PEPCID) 20 MG tablet   furosemide (LASIX) 20 MG tablet   levothyroxine (SYNTHROID, LEVOTHROID) 25 MCG tablet   meloxicam (MOBIC) 15 MG tablet   metFORMIN (GLUCOPHAGE-XR) 500 MG 24 hr tablet   Olmesartan-amLODIPine-HCTZ 40-10-25 MG TABS   omeprazole (PRILOSEC) 40 MG capsule   OZEMPIC, 2 MG/DOSE, 8 MG/3ML SOPN   rosuvastatin (CRESTOR) 40 MG tablet   No current facility-administered medications for this encounter.      [name], PA-C [facility] [phone]

## 2024-01-16 NOTE — Anesthesia Preprocedure Evaluation (Signed)
 Anesthesia Evaluation  Patient identified by MRN, date of birth, ID band Patient awake    Reviewed: Allergy & Precautions, NPO status , Patient's Chart, lab work & pertinent test results  History of Anesthesia Complications (+) PONV and history of anesthetic complications  Airway Mallampati: III  TM Distance: >3 FB Neck ROM: Full    Dental  (+) Edentulous Upper   Pulmonary Current Smoker   Pulmonary exam normal        Cardiovascular hypertension, Pt. on medications + Peripheral Vascular Disease and +CHF  Normal cardiovascular exam  TTE 12/28/23: EF 60-65%, mild LVH, grade 1 DD, valves ok   Neuro/Psych   Anxiety        GI/Hepatic Neg liver ROS,GERD  Medicated,,  Endo/Other  diabetes (on Ozempic), Type 2, Oral Hypoglycemic AgentsHypothyroidism    Renal/GU Renal InsufficiencyRenal disease     Musculoskeletal  (+) Arthritis ,    Abdominal   Peds  Hematology negative hematology ROS (+)   Anesthesia Other Findings Day of surgery medications reviewed with patient.  Reproductive/Obstetrics                              Anesthesia Physical Anesthesia Plan  ASA: 2  Anesthesia Plan: Spinal   Post-op Pain Management: Tylenol  PO (pre-op)*   Induction:   PONV Risk Score and Plan: Treatment may vary due to age or medical condition, Ondansetron , Propofol  infusion, Dexamethasone  and Midazolam   Airway Management Planned: Natural Airway and Simple Face Mask  Additional Equipment: None  Intra-op Plan:   Post-operative Plan:   Informed Consent: I have reviewed the patients History and Physical, chart, labs and discussed the procedure including the risks, benefits and alternatives for the proposed anesthesia with the patient or authorized representative who has indicated his/her understanding and acceptance.       Plan Discussed with: CRNA  Anesthesia Plan Comments:           Anesthesia Quick Evaluation

## 2024-01-17 ENCOUNTER — Ambulatory Visit (HOSPITAL_BASED_OUTPATIENT_CLINIC_OR_DEPARTMENT_OTHER): Admitting: Anesthesiology

## 2024-01-17 ENCOUNTER — Ambulatory Visit (HOSPITAL_COMMUNITY): Payer: Self-pay | Admitting: Medical

## 2024-01-17 ENCOUNTER — Encounter (HOSPITAL_COMMUNITY): Payer: Self-pay | Admitting: Orthopedic Surgery

## 2024-01-17 ENCOUNTER — Observation Stay (HOSPITAL_COMMUNITY)
Admission: RE | Admit: 2024-01-17 | Discharge: 2024-01-18 | Disposition: A | Discharge status: Home / Self Care | Attending: Orthopedic Surgery | Admitting: Orthopedic Surgery

## 2024-01-17 ENCOUNTER — Other Ambulatory Visit: Payer: Self-pay

## 2024-01-17 ENCOUNTER — Encounter (HOSPITAL_COMMUNITY)
Admission: RE | Disposition: A | Discharge status: Home / Self Care | Payer: Self-pay | Source: Home / Self Care | Attending: Orthopedic Surgery

## 2024-01-17 ENCOUNTER — Ambulatory Visit (HOSPITAL_COMMUNITY)

## 2024-01-17 ENCOUNTER — Observation Stay (HOSPITAL_COMMUNITY)

## 2024-01-17 DIAGNOSIS — N183 Chronic kidney disease, stage 3 unspecified: Secondary | ICD-10-CM | POA: Insufficient documentation

## 2024-01-17 DIAGNOSIS — Z01818 Encounter for other preprocedural examination: Secondary | ICD-10-CM

## 2024-01-17 DIAGNOSIS — Z96641 Presence of right artificial hip joint: Secondary | ICD-10-CM | POA: Diagnosis not present

## 2024-01-17 DIAGNOSIS — M1611 Unilateral primary osteoarthritis, right hip: Secondary | ICD-10-CM

## 2024-01-17 DIAGNOSIS — I509 Heart failure, unspecified: Secondary | ICD-10-CM | POA: Insufficient documentation

## 2024-01-17 DIAGNOSIS — E039 Hypothyroidism, unspecified: Secondary | ICD-10-CM | POA: Diagnosis not present

## 2024-01-17 DIAGNOSIS — I130 Hypertensive heart and chronic kidney disease with heart failure and stage 1 through stage 4 chronic kidney disease, or unspecified chronic kidney disease: Secondary | ICD-10-CM | POA: Diagnosis not present

## 2024-01-17 DIAGNOSIS — F17210 Nicotine dependence, cigarettes, uncomplicated: Secondary | ICD-10-CM | POA: Insufficient documentation

## 2024-01-17 DIAGNOSIS — E1122 Type 2 diabetes mellitus with diabetic chronic kidney disease: Secondary | ICD-10-CM | POA: Diagnosis not present

## 2024-01-17 DIAGNOSIS — Z7982 Long term (current) use of aspirin: Secondary | ICD-10-CM | POA: Insufficient documentation

## 2024-01-17 HISTORY — PX: TOTAL HIP ARTHROPLASTY: SHX124

## 2024-01-17 LAB — GLUCOSE, CAPILLARY
Glucose-Capillary: 90 mg/dL (ref 70–99)
Glucose-Capillary: 109 mg/dL — ABNORMAL HIGH (ref 70–99)
Glucose-Capillary: 116 mg/dL — ABNORMAL HIGH (ref 70–99)
Glucose-Capillary: 177 mg/dL — ABNORMAL HIGH (ref 70–99)

## 2024-01-17 LAB — ABO/RH: ABO/RH(D): B POS

## 2024-01-17 SURGERY — ARTHROPLASTY, HIP, TOTAL, ANTERIOR APPROACH
Anesthesia: Spinal | Site: Hip | Laterality: Right

## 2024-01-17 MED ORDER — LACTATED RINGERS IV SOLN: INTRAVENOUS | Status: DC

## 2024-01-17 MED ORDER — FENTANYL CITRATE (PF) 100 MCG/2ML IJ SOLN
INTRAMUSCULAR | Status: DC | PRN
  Administered 2024-01-17 (×2): 50 ug via INTRAVENOUS

## 2024-01-17 MED ORDER — BUSPIRONE HCL 5 MG PO TABS
5.0000 mg | ORAL_TABLET | Freq: Two times a day (BID) | ORAL | Status: DC
  Administered 2024-01-17 – 2024-01-18 (×2): 5 mg via ORAL
  Filled 2024-01-17 (×2): qty 1

## 2024-01-17 MED ORDER — HYDROMORPHONE HCL 1 MG/ML IJ SOLN: 0.5000 mg | INTRAMUSCULAR | Status: DC | PRN

## 2024-01-17 MED ORDER — IRBESARTAN 150 MG PO TABS: 300.0000 mg | ORAL_TABLET | Freq: Every day | ORAL | Status: DC

## 2024-01-17 MED ORDER — SODIUM CHLORIDE 0.9 % IV SOLN: INTRAVENOUS | Status: DC

## 2024-01-17 MED ORDER — CHLORHEXIDINE GLUCONATE 0.12 % MT SOLN
15.0000 mL | Freq: Once | OROMUCOSAL | Status: AC
  Administered 2024-01-17: 15 mL via OROMUCOSAL

## 2024-01-17 MED ORDER — MENTHOL 3 MG MT LOZG: 1.0000 | LOZENGE | OROMUCOSAL | Status: DC | PRN

## 2024-01-17 MED ORDER — METHOCARBAMOL 500 MG PO TABS
500.0000 mg | ORAL_TABLET | Freq: Four times a day (QID) | ORAL | Status: DC | PRN
  Administered 2024-01-17 – 2024-01-18 (×2): 500 mg via ORAL
  Filled 2024-01-17 (×2): qty 1

## 2024-01-17 MED ORDER — LEVOTHYROXINE SODIUM 25 MCG PO TABS
25.0000 ug | ORAL_TABLET | Freq: Every day | ORAL | Status: DC
  Administered 2024-01-18: 25 ug via ORAL
  Filled 2024-01-17: qty 1

## 2024-01-17 MED ORDER — PROPOFOL 500 MG/50ML IV EMUL
INTRAVENOUS | Status: DC | PRN
  Administered 2024-01-17: 170 mg via INTRAVENOUS
  Administered 2024-01-17: 100 ug/kg/min via INTRAVENOUS

## 2024-01-17 MED ORDER — SODIUM CHLORIDE (PF) 0.9 % IJ SOLN
INTRAMUSCULAR | Status: DC | PRN
  Administered 2024-01-17: 61 mL

## 2024-01-17 MED ORDER — ASPIRIN 81 MG PO CHEW
81.0000 mg | CHEWABLE_TABLET | Freq: Two times a day (BID) | ORAL | Status: DC
  Administered 2024-01-17 – 2024-01-18 (×2): 81 mg via ORAL
  Filled 2024-01-17 (×2): qty 1

## 2024-01-17 MED ORDER — DEXAMETHASONE SODIUM PHOSPHATE 10 MG/ML IJ SOLN
10.0000 mg | Freq: Once | INTRAMUSCULAR | Status: AC
  Administered 2024-01-18: 10 mg via INTRAVENOUS
  Filled 2024-01-17: qty 1

## 2024-01-17 MED ORDER — TRANEXAMIC ACID-NACL 1000-0.7 MG/100ML-% IV SOLN
1000.0000 mg | INTRAVENOUS | Status: AC
  Administered 2024-01-17: 1000 mg via INTRAVENOUS
  Filled 2024-01-17: qty 100

## 2024-01-17 MED ORDER — ACETAMINOPHEN 500 MG PO TABS
1000.0000 mg | ORAL_TABLET | Freq: Once | ORAL | Status: AC
  Administered 2024-01-17: 1000 mg via ORAL
  Filled 2024-01-17: qty 2

## 2024-01-17 MED ORDER — BUPIVACAINE-EPINEPHRINE (PF) 0.25% -1:200000 IJ SOLN
INTRAMUSCULAR | Status: AC
  Filled 2024-01-17: qty 30

## 2024-01-17 MED ORDER — INSULIN ASPART 100 UNIT/ML IJ SOLN
0.0000 [IU] | Freq: Three times a day (TID) | INTRAMUSCULAR | Status: DC
  Administered 2024-01-18: 2 [IU] via SUBCUTANEOUS

## 2024-01-17 MED ORDER — KETOROLAC TROMETHAMINE 30 MG/ML IJ SOLN
INTRAMUSCULAR | Status: AC
  Filled 2024-01-17: qty 1

## 2024-01-17 MED ORDER — 0.9 % SODIUM CHLORIDE (POUR BTL) OPTIME
TOPICAL | Status: DC | PRN
  Administered 2024-01-17: 1000 mL

## 2024-01-17 MED ORDER — BISACODYL 10 MG RE SUPP
10.0000 mg | Freq: Every day | RECTAL | Status: DC | PRN
Start: 2024-01-17 — End: 2024-01-18

## 2024-01-17 MED ORDER — FUROSEMIDE 20 MG PO TABS: 20.0000 mg | ORAL_TABLET | Freq: Every day | ORAL | Status: DC | PRN

## 2024-01-17 MED ORDER — TRAMADOL HCL 50 MG PO TABS
50.0000 mg | ORAL_TABLET | Freq: Four times a day (QID) | ORAL | Status: DC | PRN
  Administered 2024-01-18: 100 mg via ORAL
  Filled 2024-01-17: qty 2

## 2024-01-17 MED ORDER — ORAL CARE MOUTH RINSE: 15.0000 mL | Freq: Once | OROMUCOSAL | Status: AC

## 2024-01-17 MED ORDER — PHENOL 1.4 % MT LIQD: 1.0000 | OROMUCOSAL | Status: DC | PRN

## 2024-01-17 MED ORDER — FAMOTIDINE 20 MG PO TABS: 20.0000 mg | ORAL_TABLET | Freq: Every day | ORAL | Status: DC | PRN

## 2024-01-17 MED ORDER — CEFAZOLIN SODIUM-DEXTROSE 2-4 GM/100ML-% IV SOLN
2.0000 g | Freq: Four times a day (QID) | INTRAVENOUS | Status: AC
  Administered 2024-01-17 (×2): 2 g via INTRAVENOUS
  Filled 2024-01-17 (×2): qty 100

## 2024-01-17 MED ORDER — SODIUM CHLORIDE (PF) 0.9 % IJ SOLN
INTRAMUSCULAR | Status: AC
  Filled 2024-01-17: qty 30

## 2024-01-17 MED ORDER — ONDANSETRON HCL 4 MG/2ML IJ SOLN
INTRAMUSCULAR | Status: DC | PRN
  Administered 2024-01-17: 4 mg via INTRAVENOUS

## 2024-01-17 MED ORDER — PHENYLEPHRINE 80 MCG/ML (10ML) SYRINGE FOR IV PUSH (FOR BLOOD PRESSURE SUPPORT)
PREFILLED_SYRINGE | INTRAVENOUS | Status: DC | PRN
  Administered 2024-01-17 (×2): 120 ug via INTRAVENOUS

## 2024-01-17 MED ORDER — ALBUMIN HUMAN 5 % IV SOLN
INTRAVENOUS | Status: AC
  Filled 2024-01-17: qty 250

## 2024-01-17 MED ORDER — OXYCODONE HCL 5 MG/5ML PO SOLN: 5.0000 mg | Freq: Once | ORAL | Status: DC | PRN

## 2024-01-17 MED ORDER — POLYETHYLENE GLYCOL 3350 17 G PO PACK
17.0000 g | PACK | Freq: Two times a day (BID) | ORAL | Status: DC
  Administered 2024-01-17 – 2024-01-18 (×2): 17 g via ORAL
  Filled 2024-01-17 (×2): qty 1

## 2024-01-17 MED ORDER — METFORMIN HCL ER 500 MG PO TB24: 500.0000 mg | ORAL_TABLET | Freq: Every day | ORAL | Status: DC

## 2024-01-17 MED ORDER — HYDROCHLOROTHIAZIDE 25 MG PO TABS: 25.0000 mg | ORAL_TABLET | Freq: Every day | ORAL | Status: DC

## 2024-01-17 MED ORDER — HYDROMORPHONE HCL 1 MG/ML IJ SOLN
INTRAMUSCULAR | Status: DC | PRN
  Administered 2024-01-17: .2 mg via INTRAVENOUS
  Administered 2024-01-17: 1 mg via INTRAVENOUS
  Administered 2024-01-17: .2 mg via INTRAVENOUS

## 2024-01-17 MED ORDER — METHOCARBAMOL 1000 MG/10ML IJ SOLN: 500.0000 mg | Freq: Four times a day (QID) | INTRAMUSCULAR | Status: DC | PRN

## 2024-01-17 MED ORDER — OLMESARTAN-AMLODIPINE-HCTZ 40-10-25 MG PO TABS: 1.0000 | ORAL_TABLET | Freq: Every day | ORAL | Status: DC

## 2024-01-17 MED ORDER — DROPERIDOL 2.5 MG/ML IJ SOLN: 0.6250 mg | Freq: Once | INTRAMUSCULAR | Status: DC | PRN

## 2024-01-17 MED ORDER — STERILE WATER FOR IRRIGATION IR SOLN
Status: DC | PRN
  Administered 2024-01-17: 1000 mL

## 2024-01-17 MED ORDER — TRANEXAMIC ACID-NACL 1000-0.7 MG/100ML-% IV SOLN
1000.0000 mg | Freq: Once | INTRAVENOUS | Status: AC
  Administered 2024-01-17: 1000 mg via INTRAVENOUS
  Filled 2024-01-17: qty 100

## 2024-01-17 MED ORDER — ONDANSETRON HCL 4 MG PO TABS: 4.0000 mg | ORAL_TABLET | Freq: Four times a day (QID) | ORAL | Status: DC | PRN

## 2024-01-17 MED ORDER — DEXAMETHASONE SODIUM PHOSPHATE 10 MG/ML IJ SOLN
8.0000 mg | Freq: Once | INTRAMUSCULAR | Status: AC
  Administered 2024-01-17: 8 mg via INTRAVENOUS

## 2024-01-17 MED ORDER — ACETAMINOPHEN 500 MG PO TABS
1000.0000 mg | ORAL_TABLET | Freq: Four times a day (QID) | ORAL | Status: DC
  Administered 2024-01-17 – 2024-01-18 (×2): 1000 mg via ORAL
  Filled 2024-01-17 (×2): qty 2

## 2024-01-17 MED ORDER — HYDROMORPHONE HCL 2 MG/ML IJ SOLN
INTRAMUSCULAR | Status: AC
  Filled 2024-01-17: qty 1

## 2024-01-17 MED ORDER — ROSUVASTATIN CALCIUM 20 MG PO TABS: 40.0000 mg | ORAL_TABLET | Freq: Every day | ORAL | Status: DC

## 2024-01-17 MED ORDER — DIPHENHYDRAMINE HCL 12.5 MG/5ML PO ELIX: 12.5000 mg | ORAL_SOLUTION | ORAL | Status: DC | PRN

## 2024-01-17 MED ORDER — PROPOFOL 1000 MG/100ML IV EMUL
INTRAVENOUS | Status: AC
  Filled 2024-01-17: qty 100

## 2024-01-17 MED ORDER — POVIDONE-IODINE 10 % EX SWAB: 2.0000 | Freq: Once | CUTANEOUS | Status: DC

## 2024-01-17 MED ORDER — MIDAZOLAM HCL 5 MG/5ML IJ SOLN
INTRAMUSCULAR | Status: DC | PRN
  Administered 2024-01-17: 1 mg via INTRAVENOUS

## 2024-01-17 MED ORDER — OXYCODONE HCL 5 MG PO TABS: 5.0000 mg | ORAL_TABLET | Freq: Once | ORAL | Status: DC | PRN

## 2024-01-17 MED ORDER — PANTOPRAZOLE SODIUM 40 MG PO TBEC
40.0000 mg | DELAYED_RELEASE_TABLET | Freq: Every day | ORAL | Status: DC
  Administered 2024-01-17 – 2024-01-18 (×2): 40 mg via ORAL
  Filled 2024-01-17 (×2): qty 1

## 2024-01-17 MED ORDER — CEFAZOLIN SODIUM-DEXTROSE 2-4 GM/100ML-% IV SOLN
2.0000 g | INTRAVENOUS | Status: AC
  Administered 2024-01-17: 2 g via INTRAVENOUS
  Filled 2024-01-17: qty 100

## 2024-01-17 MED ORDER — FENTANYL CITRATE (PF) 100 MCG/2ML IJ SOLN
INTRAMUSCULAR | Status: AC
  Filled 2024-01-17: qty 2

## 2024-01-17 MED ORDER — ONDANSETRON HCL 4 MG/2ML IJ SOLN: 4.0000 mg | Freq: Four times a day (QID) | INTRAMUSCULAR | Status: DC | PRN

## 2024-01-17 MED ORDER — FENTANYL CITRATE PF 50 MCG/ML IJ SOSY: 25.0000 ug | PREFILLED_SYRINGE | INTRAMUSCULAR | Status: DC | PRN

## 2024-01-17 MED ORDER — MIDAZOLAM HCL 2 MG/2ML IJ SOLN
INTRAMUSCULAR | Status: AC
Start: 2024-01-17 — End: 2024-01-17
  Filled 2024-01-17: qty 2

## 2024-01-17 MED ORDER — ALPRAZOLAM 0.5 MG PO TABS: 0.5000 mg | ORAL_TABLET | Freq: Every evening | ORAL | Status: DC | PRN

## 2024-01-17 MED ORDER — SENNA 8.6 MG PO TABS
2.0000 | ORAL_TABLET | Freq: Every day | ORAL | Status: DC
  Administered 2024-01-17: 17.2 mg via ORAL
  Filled 2024-01-17: qty 2

## 2024-01-17 MED ORDER — EPHEDRINE SULFATE-NACL 50-0.9 MG/10ML-% IV SOSY
PREFILLED_SYRINGE | INTRAVENOUS | Status: DC | PRN
  Administered 2024-01-17: 10 mg via INTRAVENOUS

## 2024-01-17 MED ORDER — AMLODIPINE BESYLATE 10 MG PO TABS: 10.0000 mg | ORAL_TABLET | Freq: Every day | ORAL | Status: DC

## 2024-01-17 MED ORDER — OXYCODONE HCL 5 MG PO TABS
5.0000 mg | ORAL_TABLET | ORAL | Status: DC | PRN
  Administered 2024-01-17: 5 mg via ORAL
  Filled 2024-01-17: qty 2

## 2024-01-17 MED ORDER — METOCLOPRAMIDE HCL 5 MG/ML IJ SOLN: 5.0000 mg | Freq: Three times a day (TID) | INTRAMUSCULAR | Status: DC | PRN

## 2024-01-17 MED ORDER — INSULIN ASPART 100 UNIT/ML IJ SOLN: 0.0000 [IU] | INTRAMUSCULAR | Status: DC | PRN

## 2024-01-17 MED ORDER — METOCLOPRAMIDE HCL 5 MG PO TABS: 5.0000 mg | ORAL_TABLET | Freq: Three times a day (TID) | ORAL | Status: DC | PRN

## 2024-01-17 MED ORDER — ALBUMIN HUMAN 5 % IV SOLN
12.5000 g | Freq: Once | INTRAVENOUS | Status: AC
  Administered 2024-01-17: 12.5 g via INTRAVENOUS

## 2024-01-17 SURGICAL SUPPLY — 37 items
BAG COUNTER SPONGE SURGICOUNT (BAG) IMPLANT
BAG ZIPLOCK 12X15 (MISCELLANEOUS) ×1 IMPLANT
BLADE SAG 18X100X1.27 (BLADE) ×1 IMPLANT
COVER PERINEAL POST (MISCELLANEOUS) ×1 IMPLANT
COVER SURGICAL LIGHT HANDLE (MISCELLANEOUS) ×1 IMPLANT
CUP ACETBLR 52 OD PINNACLE (Hips) IMPLANT
DERMABOND ADVANCED .7 DNX12 (GAUZE/BANDAGES/DRESSINGS) ×1 IMPLANT
DRAPE FOOT SWITCH (DRAPES) ×1 IMPLANT
DRAPE STERI IOBAN 125X83 (DRAPES) ×1 IMPLANT
DRAPE U-SHAPE 47X51 STRL (DRAPES) ×2 IMPLANT
DRESSING AQUACEL AG SP 3.5X10 (GAUZE/BANDAGES/DRESSINGS) ×1 IMPLANT
DURAPREP 26ML APPLICATOR (WOUND CARE) ×1 IMPLANT
ELECT REM PT RETURN 15FT ADLT (MISCELLANEOUS) ×1 IMPLANT
GLOVE BIO SURGEON STRL SZ 6 (GLOVE) ×1 IMPLANT
GLOVE BIOGEL PI IND STRL 6.5 (GLOVE) ×1 IMPLANT
GLOVE BIOGEL PI IND STRL 7.5 (GLOVE) ×1 IMPLANT
GLOVE ORTHO TXT STRL SZ7.5 (GLOVE) ×2 IMPLANT
GOWN STRL REUS W/ TWL LRG LVL3 (GOWN DISPOSABLE) ×2 IMPLANT
HEAD CERAMIC DELTA 36 PLUS 1.5 (Hips) IMPLANT
HOLDER FOLEY CATH W/STRAP (MISCELLANEOUS) ×1 IMPLANT
KIT TURNOVER KIT A (KITS) ×1 IMPLANT
LINER NEUTRAL 52X36MM PLUS 4 (Liner) IMPLANT
MANIFOLD NEPTUNE II (INSTRUMENTS) ×1 IMPLANT
NDL SAFETY ECLIPSE 18X1.5 (NEEDLE) ×1 IMPLANT
PACK ANTERIOR HIP CUSTOM (KITS) ×1 IMPLANT
PENCIL SMOKE EVACUATOR (MISCELLANEOUS) ×1 IMPLANT
SCREW 6.5MMX30MM (Screw) IMPLANT
STEM FEMORAL SZ5 HIGH ACTIS (Stem) IMPLANT
SUT MNCRL AB 4-0 PS2 18 (SUTURE) ×1 IMPLANT
SUT VIC AB 1 CT1 36 (SUTURE) ×3 IMPLANT
SUT VIC AB 2-0 CT1 TAPERPNT 27 (SUTURE) ×2 IMPLANT
SUTURE STRATFX 0 PDS 27 VIOLET (SUTURE) ×1 IMPLANT
SYR 3ML LL SCALE MARK (SYRINGE) ×1 IMPLANT
TOWEL GREEN STERILE FF (TOWEL DISPOSABLE) ×1 IMPLANT
TRAY FOLEY MTR SLVR 16FR STAT (SET/KITS/TRAYS/PACK) ×1 IMPLANT
TUBE SUCTION HIGH CAP CLEAR NV (SUCTIONS) ×1 IMPLANT
WATER STERILE IRR 1000ML POUR (IV SOLUTION) ×1 IMPLANT

## 2024-01-17 NOTE — Anesthesia Procedure Notes (Signed)
 Procedure Name: LMA Insertion Date/Time: 01/17/2024 11:05 AM  Performed by: Delores Duwaine SAUNDERS, CRNAPre-anesthesia Checklist: Patient identified, Emergency Drugs available, Suction available and Patient being monitored Patient Re-evaluated:Patient Re-evaluated prior to induction Oxygen Delivery Method: Circle System Utilized Preoxygenation: Pre-oxygenation with 100% oxygen Induction Type: IV induction Ventilation: Mask ventilation without difficulty LMA: LMA inserted LMA Size: 4.0 Number of attempts: 1 Airway Equipment and Method: Bite block Placement Confirmation: positive ETCO2 Tube secured with: Tape Dental Injury: Teeth and Oropharynx as per pre-operative assessment

## 2024-01-17 NOTE — Evaluation (Signed)
 Physical Therapy Evaluation Patient Details Name: Heidi Maddox MRN: [id number] DOB: 04/20/1951 Today's Date: 01/17/2024  History of Present Illness  73 yo female presents to therapy s/p R THA, anterior approach secondary to failure of conservative measures. Pt PMH includes but is not limited to: HTN, DM II, PAD, CHF, CKD, GERD, GAD, HLD, and tobacco abuse.  Clinical Impression    Heidi Maddox is a 73 y.o. female POD 0 s/p R THA. Patient reports mod I with mobility at baseline. Patient is now limited by functional impairments (see PT problem list below) and requires CGA for bed mobility and CGA for transfers. Patient was able to ambulate 50 feet with RW and CGA level of assist. Patient instructed in exercise to facilitate ROM and circulation to manage edema. Patient will benefit from continued skilled PT interventions to address impairments and progress towards PLOF. Acute PT will follow to progress mobility and stair training in preparation for safe discharge home with family support and HEP.       If plan is discharge home, recommend the following: A little help with walking and/or transfers;A little help with bathing/dressing/bathroom;Assistance with cooking/housework;Assist for transportation;Help with stairs or ramp for entrance   Can travel by private vehicle        Equipment Recommendations Rolling walker (2 wheels)  Recommendations for Other Services       Functional Status Assessment Patient has had a recent decline in their functional status and demonstrates the ability to make significant improvements in function in a reasonable and predictable amount of time.     Precautions / Restrictions Precautions Precautions: Fall Restrictions Weight Bearing Restrictions Per Provider Order: Yes RLE Weight Bearing Per Provider Order: Weight bearing as tolerated      Mobility  Bed Mobility Overal bed mobility: Needs Assistance Bed Mobility: Supine to Sit     Supine to sit:  Contact guard, HOB elevated     General bed mobility comments: min cues    Transfers Overall transfer level: Needs assistance Equipment used: Rolling walker (2 wheels) Transfers: Sit to/from Stand Sit to Stand: Min assist           General transfer comment: min cues and pull to stand    Ambulation/Gait Ambulation/Gait assistance: Contact guard assist Gait Distance (Feet): 50 Feet Assistive device: Rolling walker (2 wheels) Gait Pattern/deviations: Step-to pattern, Antalgic, Trunk flexed, Decreased stance time - right Gait velocity: decreased     General Gait Details: flexed trunk with B UE support at RW to offload R LE in stance phase, pt reports amb with flexed posture PLOF and able to modify but not maintain extension with gait tasks, min cues for safety and RW management  Stairs            Wheelchair Mobility     Tilt Bed    Modified Rankin (Stroke Patients Only)       Balance Overall balance assessment: Needs assistance Sitting-balance support: Feet supported Sitting balance-Leahy Scale: Good     Standing balance support: Bilateral upper extremity supported, During functional activity, Reliant on assistive device for balance Standing balance-Leahy Scale: Fair Standing balance comment: static standing no UE support                             Pertinent Vitals/Pain Pain Assessment Pain Assessment: 0-10 Pain Score: 3  Pain Location: R hip and LE Pain Descriptors / Indicators: Aching, Constant, Discomfort Pain Intervention(s): Limited activity within patient's  tolerance, Monitored during session, Premedicated before session, Repositioned    Home Living Family/patient expects to be discharged to:: Private residence Living Arrangements: Other relatives (grandson) Available Help at Discharge: Family Type of Home: House Home Access: Stairs to enter Entrance Stairs-Rails: None Secretary/administrator of Steps: 1   Home Layout: One  level Home Equipment: Cane - single point Additional Comments: family may order shower chair    Prior Function Prior Level of Function : Independent/Modified Independent;Driving             Mobility Comments: mod I with SPC for all ADLs, self care tasks and IADLs       Extremity/Trunk Assessment        Lower Extremity Assessment Lower Extremity Assessment: RLE deficits/detail RLE Deficits / Details: ankle DF/PF 5/5; SLR < 10 degree lag RLE Sensation: WNL    Cervical / Trunk Assessment Cervical / Trunk Assessment: Normal  Communication   Communication Communication: No apparent difficulties    Cognition Arousal: Alert Behavior During Therapy: WFL for tasks assessed/performed   PT - Cognitive impairments: No apparent impairments                         Following commands: Intact       Cueing       General Comments      Exercises Total Joint Exercises Ankle Circles/Pumps: AROM, 10 reps, Both   Assessment/Plan    PT Assessment Patient needs continued PT services  PT Problem List Decreased strength;Decreased range of motion;Decreased activity tolerance;Decreased balance;Decreased mobility;Decreased coordination;Pain       PT Treatment Interventions DME instruction;Gait training;Stair training;Functional mobility training;Therapeutic activities;Therapeutic exercise;Balance training;Neuromuscular re-education;Patient/family education;Modalities    PT Goals (Current goals can be found in the Care Plan section)  Acute Rehab PT Goals Patient Stated Goal: to be able to line dance PT Goal Formulation: With patient Time For Goal Achievement: 01/31/24 Potential to Achieve Goals: Good    Frequency 7X/week     Co-evaluation               AM-PAC PT 6 Clicks Mobility  Outcome Measure Help needed turning from your back to your side while in a flat bed without using bedrails?: None Help needed moving from lying on your back to sitting on the  side of a flat bed without using bedrails?: A Little Help needed moving to and from a bed to a chair (including a wheelchair)?: A Little Help needed standing up from a chair using your arms (e.g., wheelchair or bedside chair)?: A Little Help needed to walk in hospital room?: A Little Help needed climbing 3-5 steps with a railing? : A Lot 6 Click Score: 18    End of Session Equipment Utilized During Treatment: Gait belt Activity Tolerance: Patient tolerated treatment well Patient left: in bed;with call bell/phone within reach;with family/visitor present (seated EOB per pt request and nursing made aware) Nurse Communication: Mobility status PT Visit Diagnosis: Unsteadiness on feet (R26.81);Other abnormalities of gait and mobility (R26.89);Muscle weakness (generalized) (M62.81);Pain;Difficulty in walking, not elsewhere classified (R26.2) Pain - Right/Left: Right Pain - part of body: Leg;Hip    Time: 8179-8152 PT Time Calculation (min) (ACUTE ONLY): 27 min   Charges:   PT Evaluation $PT Eval Low Complexity: 1 Low PT Treatments $Gait Training: 8-22 mins PT General Charges $$ ACUTE PT VISIT: 1 Visit         [place], PT Acute Rehab   [place] VEAR Drone 01/17/2024, 7:23 PM

## 2024-01-17 NOTE — Discharge Instructions (Signed)
 Dr. Donnice Car Total Joint Specialist [facility] [address] [place], [place] [address] [phone]  Pain Regimen Instructions:  - Take tylenol  1000 mg every 6-8 hours around the clock  - Take the muscle relaxant around the clock  - Then take 5 mg (1 tablet) of oxycodone  every 4 hours as needed for severe pain  - Ice and elevate your leg as often as you can  Do not take over the counter pain medication until instructed by us .  If this is not controlling your pain, or you need refills - call our office at [phone] or send a message via the [employer] portal.   Take the stool softeners provided until you are having regular bowel movements, and then you may discontinue.    INSTRUCTIONS AFTER JOINT REPLACEMENT   Remove items at home which could result in a fall. This includes throw rugs or furniture in walking pathways ICE to the affected joint every three hours while awake for 30 minutes at a time, for at least the first 3-5 days, and then as needed for pain and swelling.  Continue to use ice for pain and swelling. You may notice swelling that will progress down to the foot and ankle.  This is normal after surgery.  Elevate your leg when you are not up walking on it.   Continue to use the breathing machine you got in the hospital (incentive spirometer) which will help keep your temperature down.  It is common for your temperature to cycle up and down following surgery, especially at night when you are not up moving around and exerting yourself.  The breathing machine keeps your lungs expanded and your temperature down.   DIET:  As you were doing prior to hospitalization, we recommend a well-balanced diet.  DRESSING / WOUND CARE / SHOWERING  Keep the surgical dressing until follow up.  The dressing is water  proof, so you can shower without any extra covering.  IF THE DRESSING FALLS OFF or the wound gets wet inside, change the dressing with sterile  gauze.  Please use good hand washing techniques before changing the dressing.  Do not use any lotions or creams on the incision until instructed by your surgeon.    ACTIVITY  Increase activity slowly as tolerated, but follow the weight bearing instructions below.   No driving for 6 weeks or until further direction given by your physician.  You cannot drive while taking narcotics.  No lifting or carrying greater than 10 lbs. until further directed by your surgeon. Avoid periods of inactivity such as sitting longer than an hour when not asleep. This helps prevent blood clots.  You may return to work once you are authorized by your doctor.     WEIGHT BEARING   Weight bearing as tolerated with assist device (walker, cane, etc) as directed, use it as long as suggested by your surgeon or therapist, typically at least 4-6 weeks.   EXERCISES  Results after joint replacement surgery are often greatly improved when you follow the exercise, range of motion and muscle strengthening exercises prescribed by your doctor. Safety measures are also important to protect the joint from further injury. Any time any of these exercises cause you to have increased pain or swelling, decrease what you are doing until you are comfortable again and then slowly increase them. If you have problems or questions, call your caregiver or physical therapist for advice.   Rehabilitation is important following a joint replacement. After  just a few days of immobilization, the muscles of the leg can become weakened and shrink (atrophy).  These exercises are designed to build up the tone and strength of the thigh and leg muscles and to improve motion. Often times heat used for twenty to thirty minutes before working out will loosen up your tissues and help with improving the range of motion but do not use heat for the first two weeks following surgery (sometimes heat can increase post-operative swelling).   These exercises can be  done on a training (exercise) mat, on the floor, on a table or on a bed. Use whatever works the best and is most comfortable for you.    Use music or television while you are exercising so that the exercises are a pleasant break in your day. This will make your life better with the exercises acting as a break in your routine that you can look forward to.   Perform all exercises about fifteen times, three times per day or as directed.  You should exercise both the operative leg and the other leg as well.  Exercises include:   Quad Sets - Tighten up the muscle on the front of the thigh (Quad) and hold for 5-10 seconds.   Straight Leg Raises - With your knee straight (if you were given a brace, keep it on), lift the leg to 60 degrees, hold for 3 seconds, and slowly lower the leg.  Perform this exercise against resistance later as your leg gets stronger.  Leg Slides: Lying on your back, slowly slide your foot toward your buttocks, bending your knee up off the floor (only go as far as is comfortable). Then slowly slide your foot back down until your leg is flat on the floor again.  Angel Wings: Lying on your back spread your legs to the side as far apart as you can without causing discomfort.  Hamstring Strength:  Lying on your back, push your heel against the floor with your leg straight by tightening up the muscles of your buttocks.  Repeat, but this time bend your knee to a comfortable angle, and push your heel against the floor.  You may put a pillow under the heel to make it more comfortable if necessary.   A rehabilitation program following joint replacement surgery can speed recovery and prevent re-injury in the future due to weakened muscles. Contact your doctor or a physical therapist for more information on knee rehabilitation.    CONSTIPATION  Constipation is defined medically as fewer than three stools per week and severe constipation as less than one stool per week.  Even if you have a regular  bowel pattern at home, your normal regimen is likely to be disrupted due to multiple reasons following surgery.  Combination of anesthesia, postoperative narcotics, change in appetite and fluid intake all can affect your bowels.   YOU MUST use at least one of the following options; they are listed in order of increasing strength to get the job done.  They are all available over the counter, and you may need to use some, POSSIBLY even all of these options:    Drink plenty of fluids (prune juice may be helpful) and high fiber foods Colace 100 mg by mouth twice a day  Senokot for constipation as directed and as needed Dulcolax (bisacodyl ), take with full glass of water   Miralax  (polyethylene glycol) once or twice a day as needed.  If you have tried all these things and are unable to  have a bowel movement in the first 3-4 days after surgery call either your surgeon or your primary doctor.    If you experience loose stools or diarrhea, hold the medications until you stool forms back up.  If your symptoms do not get better within 1 week or if they get worse, check with your doctor.  If you experience the worst abdominal pain ever or develop nausea or vomiting, please contact the office immediately for further recommendations for treatment.   ITCHING:  If you experience itching with your medications, try taking only a single pain pill, or even half a pain pill at a time.  You can also use Benadryl  over the counter for itching or also to help with sleep.   TED HOSE STOCKINGS:  Use stockings on both legs until for at least 2 weeks or as directed by physician office. They may be removed at night for sleeping.  MEDICATIONS:  See your medication summary on the "After Visit Summary" that nursing will review with you.  You may have some home medications which will be placed on hold until you complete the course of blood thinner medication.  It is important for you to complete the blood thinner medication as  prescribed.  PRECAUTIONS:  If you experience chest pain or shortness of breath - call 911 immediately for transfer to the hospital emergency department.   If you develop a fever greater that 101 F, purulent drainage from wound, increased redness or drainage from wound, foul odor from the wound/dressing, or calf pain - CONTACT YOUR SURGEON.                                                   FOLLOW-UP APPOINTMENTS:  If you do not already have a post-op appointment, please call the office for an appointment to be seen by your surgeon.  Guidelines for how soon to be seen are listed in your "After Visit Summary", but are typically between 1-4 weeks after surgery.  OTHER INSTRUCTIONS:   Knee Replacement:  Do not place pillow under knee, focus on keeping the knee straight while resting. CPM instructions: 0-90 degrees, 2 hours in the morning, 2 hours in the afternoon, and 2 hours in the evening. Place foam block, curve side up under heel at all times except when in CPM or when walking.  DO NOT modify, tear, cut, or change the foam block in any way.  POST-OPERATIVE OPIOID TAPER INSTRUCTIONS: It is important to wean off of your opioid medication as soon as possible. If you do not need pain medication after your surgery it is ok to stop day one. Opioids include: Codeine, Hydrocodone(Norco, Vicodin), Oxycodone (Percocet, oxycontin ) and hydromorphone  amongst others.  Long term and even short term use of opiods can cause: Increased pain response Dependence Constipation Depression Respiratory depression And more.  Withdrawal symptoms can include Flu like symptoms Nausea, vomiting And more Techniques to manage these symptoms Hydrate well Eat regular healthy meals Stay active Use relaxation techniques(deep breathing, meditating, yoga) Do Not substitute Alcohol to help with tapering If you have been on opioids for less than two weeks and do not have pain than it is ok to stop all together.  Plan to  wean off of opioids This plan should start within one week post op of your joint replacement. Maintain the same interval or time between  taking each dose and first decrease the dose.  Cut the total daily intake of opioids by one tablet each day Next start to increase the time between doses. The last dose that should be eliminated is the evening dose.   MAKE SURE YOU:  Understand these instructions.  Get help right away if you are not doing well or get worse.    Thank you for letting us  be a part of your medical care team.  It is a privilege we respect greatly.  We hope these instructions will help you stay on track for a fast and full recovery!

## 2024-01-17 NOTE — Interval H&P Note (Signed)
 History and Physical Interval Note:  01/17/2024 9:49 AM  Heidi Maddox  has presented today for surgery, with the diagnosis of Right hip osteoarthritis.  The various methods of treatment have been discussed with the patient and family. After consideration of risks, benefits and other options for treatment, the patient has consented to  Procedure(s): ARTHROPLASTY, HIP, TOTAL, ANTERIOR APPROACH (Right) as a surgical intervention.  The patient's history has been reviewed, patient examined, no change in status, stable for surgery.  I have reviewed the patient's chart and labs.  Questions were answered to the patient's satisfaction.     [name]

## 2024-01-17 NOTE — Care Plan (Signed)
 Ortho Bundle Case Management Note  Patient Details  Name: Heidi Maddox MRN: [id number] Date of Birth: 22-Jun-1950  R THA on 01/17/24  DCP: Home with with daughter (to patient's house) DME: RW ordered through [facility] PT: HEP                  DME Arranged:  Vannie rolling DME Agency:  [employer]  HH Arranged:    HH Agency:     Additional Comments: Please contact me with any questions of if this plan should need to change.  Lyle Pepper, CCM [facility] [phone]   01/17/2024, 9:22 AM

## 2024-01-17 NOTE — Anesthesia Postprocedure Evaluation (Signed)
 Anesthesia Post Note  Patient: [name]  Procedure(s) Performed: ARTHROPLASTY, HIP, TOTAL, ANTERIOR APPROACH (Right: Hip)     Patient location during evaluation: PACU Anesthesia Type: General Level of consciousness: awake and alert Pain management: pain level controlled Vital Signs Assessment: post-procedure vital signs reviewed and stable Respiratory status: spontaneous breathing, nonlabored ventilation and respiratory function stable Cardiovascular status: blood pressure returned to baseline Postop Assessment: no apparent nausea or vomiting, no headache and no backache Anesthetic complications: no   No notable events documented.  Last Vitals:  Vitals:   01/17/24 1345 01/17/24 1400  BP: 122/61 116/66  Pulse: 68 66  Resp: 14 12  Temp:  (!) 36.2 C  SpO2: 93% 92%    Last Pain:  Vitals:   01/17/24 0905  TempSrc:   PainSc: 0-No pain                 Vertell Row

## 2024-01-17 NOTE — Transfer of Care (Signed)
 Immediate Anesthesia Transfer of Care Note  Patient: [name]  Procedure(s) Performed: ARTHROPLASTY, HIP, TOTAL, ANTERIOR APPROACH (Right: Hip)  Patient Location: PACU  Anesthesia Type:General  Level of Consciousness: sedated  Airway & Oxygen Therapy: Patient Spontanous Breathing  Post-op Assessment: Report given to RN  Post vital signs: Reviewed and stable  Last Vitals:  Vitals Value Taken Time  BP 92/46 01/17/24 12:34  Temp    Pulse 69 01/17/24 12:37  Resp    SpO2 91 % 01/17/24 12:37  Vitals shown include unfiled device data.  Last Pain:  Vitals:   01/17/24 0905  TempSrc:   PainSc: 0-No pain         Complications: No notable events documented.

## 2024-01-17 NOTE — Op Note (Signed)
 NAME:  Heidi Maddox                ACCOUNT NO.: [id number]      MEDICAL RECORD NO.: [id number]      FACILITY:  [facility]      PHYSICIAN:  [name]  DATE OF BIRTH:  December 05, 1950     DATE OF PROCEDURE:  01/17/2024                                 OPERATIVE REPORT         PREOPERATIVE DIAGNOSIS: Right  hip osteoarthritis.      POSTOPERATIVE DIAGNOSIS:  Right hip osteoarthritis.      PROCEDURE:  Right total hip replacement through an anterior approach   utilizing DePuy THR system, component size 52 mm pinnacle cup, a size 36+4 neutral   Altrex liner, a size 5 Hi Actis stem with a 36+1.5 delta ceramic   ball.      SURGEON:  Donnice JONETTA. Car, M.D.      ASSISTANT:  Randine Ricks, PA-C     ANESTHESIA:  Spinal.      SPECIMENS:  None.      COMPLICATIONS:  None.      BLOOD LOSS:  300 cc     DRAINS:  None.      INDICATION OF THE PROCEDURE:  Heidi Maddox is a 72 y.o. female who had   presented to office for evaluation of right hip pain.  Radiographs revealed   progressive degenerative changes with bone-on-bone   articulation of the  hip joint, including subchondral cystic changes and osteophytes.  The patient had painful limited range of   motion significantly affecting their overall quality of life and function.  The patient was failing to    respond to conservative measures including medications and/or injections and activity modification and at this point was ready   to proceed with more definitive measures.  Consent was obtained for   benefit of pain relief.  Specific risks of infection, DVT, component   failure, dislocation, neurovascular injury, and need for revision surgery were reviewed in the office.     PROCEDURE IN DETAIL:  The patient was brought to operative theater.   Once adequate anesthesia, preoperative antibiotics, 2 gm of Ancef , 1 gm of Tranexamic Acid , and 10 mg of Decadron  were administered, the patient was positioned supine on the [employer] table.  Once the patient was safely positioned with adequate padding of boney prominences we predraped out the hip, and used fluoroscopy to confirm orientation of the pelvis.      The right hip was then prepped and draped from proximal iliac crest to   mid thigh with a shower curtain technique.      Time-out was performed identifying the patient, planned procedure, and the appropriate extremity.     An incision was then made 2 cm lateral to the   anterior superior iliac spine extending over the orientation of the   tensor fascia lata muscle and sharp dissection was carried down to the   fascia of the muscle.      The fascia was then incised.  The muscle belly was identified and swept   laterally and retractor placed along the superior neck.  Following   cauterization of the circumflex vessels and removing some pericapsular   fat, a second cobra retractor was placed on the inferior  neck.  A T-capsulotomy was made along the line of the   superior neck to the trochanteric fossa, then extended proximally and   distally.  Tag sutures were placed and the retractors were then placed   intracapsular.  We then identified the trochanteric fossa and   orientation of my neck cut and then made a neck osteotomy with the femur on traction.  The femoral   head was removed without difficulty or complication.  Traction was let   off and retractors were placed posterior and anterior around the   acetabulum.      The labrum and foveal tissue were debrided.  I began reaming with a 47 mm   reamer and reamed up to 51 mm reamer with good bony bed preparation and a 52 mm  cup was chosen.  The final 52 mm Pinnacle cup was then impacted under fluoroscopy to confirm the depth of penetration and orientation with respect to   Abduction and forward flexion.  A screw was placed into the ilium followed by the hole eliminator.  The final   36+4 neutral Altrex liner was impacted with good visualized rim fit.  The cup  was positioned anatomically within the acetabular portion of the pelvis.      At this point, the femur was rolled to 100 degrees.  Further capsule was   released off the inferior aspect of the femoral neck.  I then   released the superior capsule proximally.  With the leg in a neutral position the hook was placed laterally   along the femur under the vastus lateralis origin and elevated manually and then held in position using the hook attachment on the bed.  The leg was then extended and adducted with the leg rolled to 100   degrees of external rotation.  Retractors were placed along the medial calcar and posteriorly over the greater trochanter.  Once the proximal femur was fully   exposed, I used a box osteotome to set orientation.  I then began   broaching with the starting chili pepper broach and passed this by hand and then broached up to 5.  With the 5 broach in place I chose a high offset neck and did several trial reductions.  The offset was appropriate, leg lengths   appeared to be equal best matched with the +1.5 head ball trial confirmed radiographically.   Given these findings, I went ahead and dislocated the hip, repositioned all   retractors and positioned the right hip in the extended and abducted position.  The final 5 Hi Actis stem was   chosen and it was impacted down to the level of neck cut.  Based on this   and the trial reductions, a final 36+1.5 delta ceramic ball was chosen and   impacted onto a clean and dry trunnion, and the hip was reduced.  The   hip had been irrigated throughout the case again at this point.  I did   reapproximate the superior capsular leaflet to the anterior leaflet   using #1 Vicryl.  The fascia of the   tensor fascia lata muscle was then reapproximated using #1 Vicryl and #0 Stratafix sutures.  The   remaining wound was closed with 2-0 Vicryl and running 4-0 Monocryl.   The hip was cleaned, dried, and dressed sterilely using Dermabond and    Aquacel dressing.  The patient was then brought   to recovery room in stable condition tolerating the procedure well.    Randine  Shuford, PA-C was present for the entirety of the case involved from   preoperative positioning, perioperative retractor management, general   facilitation of the case, as well as primary wound closure as assistant.            Donnice CORDOBA Ernie, M.D.        01/17/2024 9:50 AM

## 2024-01-18 ENCOUNTER — Other Ambulatory Visit (HOSPITAL_COMMUNITY): Payer: Self-pay

## 2024-01-18 ENCOUNTER — Encounter (HOSPITAL_COMMUNITY): Payer: Self-pay | Admitting: Orthopedic Surgery

## 2024-01-18 DIAGNOSIS — M1611 Unilateral primary osteoarthritis, right hip: Secondary | ICD-10-CM | POA: Diagnosis not present

## 2024-01-18 DIAGNOSIS — Z7982 Long term (current) use of aspirin: Secondary | ICD-10-CM | POA: Diagnosis not present

## 2024-01-18 DIAGNOSIS — I509 Heart failure, unspecified: Secondary | ICD-10-CM | POA: Diagnosis not present

## 2024-01-18 DIAGNOSIS — E039 Hypothyroidism, unspecified: Secondary | ICD-10-CM | POA: Diagnosis not present

## 2024-01-18 DIAGNOSIS — N183 Chronic kidney disease, stage 3 unspecified: Secondary | ICD-10-CM | POA: Diagnosis not present

## 2024-01-18 DIAGNOSIS — F17210 Nicotine dependence, cigarettes, uncomplicated: Secondary | ICD-10-CM | POA: Diagnosis not present

## 2024-01-18 DIAGNOSIS — E1122 Type 2 diabetes mellitus with diabetic chronic kidney disease: Secondary | ICD-10-CM | POA: Diagnosis not present

## 2024-01-18 DIAGNOSIS — Z96641 Presence of right artificial hip joint: Secondary | ICD-10-CM | POA: Diagnosis not present

## 2024-01-18 LAB — BASIC METABOLIC PANEL WITH GFR
Anion gap: 14 (ref 5–15)
BUN: 20 mg/dL (ref 8–23)
CO2: 21 mmol/L — ABNORMAL LOW (ref 22–32)
Calcium: 9.3 mg/dL (ref 8.9–10.3)
Chloride: 105 mmol/L (ref 98–111)
Creatinine, Ser: 0.99 mg/dL (ref 0.44–1.00)
GFR, Estimated: >60 mL/min (ref >60)
Glucose, Bld: 146 mg/dL — ABNORMAL HIGH (ref 70–99)
Potassium: 3.8 mmol/L (ref 3.5–5.1)
Sodium: 140 mmol/L (ref 135–145)

## 2024-01-18 LAB — CBC
HCT: 34.1 % — ABNORMAL LOW (ref 36.0–46.0)
Hemoglobin: 10.8 g/dL — ABNORMAL LOW (ref 12.0–15.0)
MCH: 27.8 pg (ref 26.0–34.0)
MCHC: 31.7 g/dL (ref 30.0–36.0)
MCV: 87.9 fL (ref 80.0–100.0)
Platelets: 208 K/uL (ref 150–400)
RBC: 3.88 MIL/uL (ref 3.87–5.11)
RDW: 14 % (ref 11.5–15.5)
WBC: 11.9 K/uL — ABNORMAL HIGH (ref 4.0–10.5)
nRBC: 0 % (ref 0.0–0.2)

## 2024-01-18 LAB — GLUCOSE, CAPILLARY
Glucose-Capillary: 139 mg/dL — ABNORMAL HIGH (ref 70–99)
Glucose-Capillary: 137 mg/dL — ABNORMAL HIGH (ref 70–99)

## 2024-01-18 MED ORDER — SENNA 8.6 MG PO TABS
2.0000 | ORAL_TABLET | Freq: Every day | ORAL | 0 refills | Status: AC
  Filled 2024-01-18: qty 28, 14d supply, fill #0

## 2024-01-18 MED ORDER — ASPIRIN 81 MG PO CHEW
81.0000 mg | CHEWABLE_TABLET | Freq: Two times a day (BID) | ORAL | 0 refills | Status: AC
  Filled 2024-01-18: qty 56, 28d supply, fill #0

## 2024-01-18 MED ORDER — METHOCARBAMOL 500 MG PO TABS
500.0000 mg | ORAL_TABLET | Freq: Four times a day (QID) | ORAL | 0 refills | Status: AC | PRN
Start: 2024-01-18 — End: ?
  Filled 2024-01-18: qty 40, 10d supply, fill #0

## 2024-01-18 MED ORDER — OXYCODONE HCL 5 MG PO TABS
2.5000 mg | ORAL_TABLET | ORAL | 0 refills | Status: AC | PRN
  Filled 2024-01-18: qty 30, 5d supply, fill #0

## 2024-01-18 MED ORDER — POLYETHYLENE GLYCOL 3350 17 GM/SCOOP PO POWD
17.0000 g | Freq: Two times a day (BID) | ORAL | 0 refills | Status: AC
  Filled 2024-01-18: qty 238, 7d supply, fill #0

## 2024-01-18 NOTE — TOC Transition Note (Signed)
 Transition of Care [facility]) - Discharge Note   Patient Details  Name: Heidi Maddox MRN: [id number] Date of Birth: 1951/02/05  Transition [facility] CM/SW Contact:  NORMAN ASPEN, LCSW Phone Number: 01/18/2024, 9:22 AM   Clinical Narrative:     Met with pt and confirming she has received RW to room via [employer].  Plan for HEP.  No further TOC needs.   Final next level of care: Home/Self Care Barriers to Discharge: No Barriers Identified   Patient Goals and CMS Choice Patient states their goals for this hospitalization and ongoing recovery are:: return home          Discharge Placement                       Discharge Plan and Services Additional resources added to the After Visit Summary for                  DME Arranged: Walker rolling DME Agency: [employer]                  Social Drivers of Health (SDOH) Interventions SDOH Screenings   Food Insecurity: No Food Insecurity (01/17/2024)  Housing: Low Risk  (01/17/2024)  Transportation Needs: No Transportation Needs (01/17/2024)  Utilities: Not At Risk (01/17/2024)  Social Connections: Moderately Integrated (01/17/2024)  Tobacco Use: High Risk (01/17/2024)     Readmission Risk Interventions     No data to display

## 2024-01-18 NOTE — Progress Notes (Signed)
 Physical Therapy Treatment Patient Details Name: Heidi Maddox MRN: [id number] DOB: 10/09/1950 Today's Date: 01/18/2024   History of Present Illness 73 yo female presents to therapy s/p R THA, anterior approach secondary to failure of conservative measures. Pt PMH includes but is not limited to: HTN, DM II, PAD, CHF, CKD, GERD, GAD, HLD, and tobacco abuse.    PT Comments  Pt received in bed, daughter at bedside. Reviewed HEP and safety precautions upon returning home with intermittent supervision. Pt did not require any physical assist for bed mobility or transfers. Good tolerance for gait training with improved upright posture and wt shifting to Right. Pt demonstrated good understanding and negotiation of single step with RW and CGA. RW received in room and adjusted to proper height. Pt appears functionally ready to return home with assist from family.     If plan is discharge home, recommend the following: A little help with walking and/or transfers;A little help with bathing/dressing/bathroom;Assistance with cooking/housework;Assist for transportation;Help with stairs or ramp for entrance   Can travel by private vehicle        Equipment Recommendations  Rolling walker (2 wheels)    Recommendations for Other Services       Precautions / Restrictions Precautions Precautions: Fall Recall of Precautions/Restrictions: Impaired Precaution/Restrictions Comments:  (Pt found ambulating in room without RW) Restrictions Weight Bearing Restrictions Per Provider Order: Yes RLE Weight Bearing Per Provider Order: Weight bearing as tolerated     Mobility  Bed Mobility Overal bed mobility: Needs Assistance Bed Mobility: Supine to Sit     Supine to sit: Modified independent (Device/Increase time), HOB elevated          Transfers Overall transfer level: Needs assistance Equipment used: Rolling walker (2 wheels) Transfers: Sit to/from Stand Sit to Stand: Supervision            General transfer comment:  (Definite use of UE's to raise from chair)    Ambulation/Gait Ambulation/Gait assistance: Contact guard assist Gait Distance (Feet):  (150) Assistive device: Rolling walker (2 wheels) Gait Pattern/deviations: Step-through pattern, Decreased stance time - right, Antalgic, Trunk flexed Gait velocity: decreased     General Gait Details:  (Improved upright posture during gait, pt encouraged to stay inside RW)   Stairs Stairs: Yes Stairs assistance: Contact guard assist Stair Management: No rails, With walker, Forwards Number of Stairs: 1 General stair comments:  (No difficulties with single step negotiation, Daughter present  for training)   Wheelchair Mobility     Tilt Bed    Modified Rankin (Stroke Patients Only)       Balance Overall balance assessment: Needs assistance Sitting-balance support: Feet supported Sitting balance-Leahy Scale: Good     Standing balance support: Bilateral upper extremity supported, During functional activity, Reliant on assistive device for balance Standing balance-Leahy Scale: Fair Standing balance comment: static standing no UE support                            Communication Communication Communication: No apparent difficulties  Cognition Arousal: Alert Behavior During Therapy: WFL for tasks assessed/performed   PT - Cognitive impairments: Safety/Judgement                         Following commands: Intact      Cueing Cueing Techniques: Verbal cues, Visual cues  Exercises Total Joint Exercises Ankle Circles/Pumps: AROM, 10 reps, Both Long Arc Quad: AROM, Both, 10 reps, Seated Other  Exercises Other Exercises:  (THA HEP given to pt and daughter and reviewed with good understanding) Other Exercises:  (Pt and daughter educated on safe transition home, increasing activity, and fall prevention)    General Comments General comments (skin integrity, edema, etc.):  (Right hip  dressing dry and intact, no significant edema noted.)      Pertinent Vitals/Pain Pain Assessment Pain Assessment: 0-10 Pain Score: 2  Pain Location: R hip and LE Pain Descriptors / Indicators: Discomfort, Operative site guarding Pain Intervention(s): Premedicated before session    Home Living                          Prior Function            PT Goals (current goals can now be found in the care plan section) Acute Rehab PT Goals Patient Stated Goal: to be able to line dance Progress towards PT goals: Progressing toward goals    Frequency    7X/week      PT Plan      Co-evaluation              AM-PAC PT 6 Clicks Mobility   Outcome Measure  Help needed turning from your back to your side while in a flat bed without using bedrails?: None Help needed moving from lying on your back to sitting on the side of a flat bed without using bedrails?: A Little Help needed moving to and from a bed to a chair (including a wheelchair)?: A Little Help needed standing up from a chair using your arms (e.g., wheelchair or bedside chair)?: A Little Help needed to walk in hospital room?: A Little Help needed climbing 3-5 steps with a railing? : A Little 6 Click Score: 19    End of Session Equipment Utilized During Treatment: Gait belt Activity Tolerance: Patient tolerated treatment well Patient left: in chair;with call bell/phone within reach;with chair alarm set;with family/visitor present Nurse Communication: Mobility status PT Visit Diagnosis: Unsteadiness on feet (R26.81);Other abnormalities of gait and mobility (R26.89);Muscle weakness (generalized) (M62.81);Pain;Difficulty in walking, not elsewhere classified (R26.2) Pain - Right/Left: Right Pain - part of body: Leg;Hip     Time: 8954-8875 PT Time Calculation (min) (ACUTE ONLY): 39 min  Charges:    $Gait Training: 8-22 mins $Therapeutic Exercise: 8-22 mins $Therapeutic Activity: 8-22 mins PT General  Charges $$ ACUTE PT VISIT: 1 Visit                    Heidi Maddox, PTA  Heidi Maddox 01/18/2024, 11:37 AM

## 2024-01-18 NOTE — Progress Notes (Signed)
 Discharge instructions given to patient and daughter, questions asked and answered. Discharge medications delivered to patient at bedside [facility]

## 2024-01-18 NOTE — Progress Notes (Signed)
   Subjective: 1 Day Post-Op Procedure(s) (LRB): ARTHROPLASTY, HIP, TOTAL, ANTERIOR APPROACH (Right) Patient reports pain as mild.   Patient seen in rounds with Dr. Ernie. Patient is resting in bed on exam this morning.Family at the bedside. No acute events overnight. Foley catheter removed. Patient ambulated 50 feet with PT yesterday. We will start therapy today.   Objective: Vital signs in last 24 hours: Temp:  [96 F (35.6 C)-97.9 F (36.6 C)] 97.7 F (36.5 C) (08/27 0539) Pulse Rate:  [62-85] 85 (08/27 0539) Resp:  [12-16] 16 (08/27 0539) BP: (81-151)/(45-77) 116/61 (08/27 0539) SpO2:  [92 %-99 %] 96 % (08/27 0539) Weight:  [98.4 kg] 98.4 kg (08/26 0905)  Intake/Output from previous day:  Intake/Output Summary (Last 24 hours) at 01/18/2024 0749 Last data filed at 01/18/2024 0600 Gross per 24 hour  Intake 1569.57 ml  Output 300 ml  Net 1269.57 ml     Intake/Output this shift: No intake/output data recorded.  Labs: Recent Labs    01/18/24 0343  HGB 10.8*   Recent Labs    01/18/24 0343  WBC 11.9*  RBC 3.88  HCT 34.1*  PLT 208   Recent Labs    01/18/24 0343  NA 140  K 3.8  CL 105  CO2 21*  BUN 20  CREATININE 0.99  GLUCOSE 146*  CALCIUM  9.3   No results for input(s): LABPT, INR in the last 72 hours.  Exam: General - Patient is Alert and Oriented Extremity - Neurologically intact Sensation intact distally Intact pulses distally Dorsiflexion/Plantar flexion intact Dressing - dressing C/D/I Motor Function - intact, moving foot and toes well on exam.   Past Medical History:  Diagnosis Date   Allergic rhinitis    Allergy    Carotid stenosis    CHF (congestive heart failure) (HCC)    Chronic kidney disease (CKD), stage III (moderate) (HCC)    Constipation    Diabetes mellitus without complication (HCC)    Dyslipidemia    Dyspnea on exertion    Esophageal reflux    GAD (generalized anxiety disorder)    GERD (gastroesophageal reflux disease)     Hip pain    History of kidney stones    Hyperlipidemia    Hypertension    Hypothyroidism    Morbid obesity with alveolar hypoventilation (HCC)    Onychogryposis of toenail    Osteoarthritis of knee    Peripheral arterial disease (HCC)    Pneumonia    PONV (postoperative nausea and vomiting)    Scald burn    Sleep disturbances    Thyroid  disease    Tobacco use    Venous insufficiency     Assessment/Plan: 1 Day Post-Op Procedure(s) (LRB): ARTHROPLASTY, HIP, TOTAL, ANTERIOR APPROACH (Right) Principal Problem:   S/P total right hip arthroplasty  Estimated body mass index is 35.56 kg/m as calculated from the following:   Height as of this encounter: 5' 5.5 (1.664 m).   Weight as of this encounter: 98.4 kg. Advance diet Up with therapy D/C IV fluids  DVT Prophylaxis - Aspirin  Weight bearing as tolerated.  Hgb stable at 10.8 this AM   Plan is to go Home after hospital stay. Plan for discharge today after meeting goals with therapy. Follow up in the office in 2 weeks.   Rosina Calin, PA-C Orthopedic Surgery [phone] 01/18/2024, 7:49 AM

## 2024-01-19 ENCOUNTER — Other Ambulatory Visit (HOSPITAL_COMMUNITY): Payer: Self-pay

## 2024-01-22 DIAGNOSIS — I509 Heart failure, unspecified: Secondary | ICD-10-CM | POA: Diagnosis not present

## 2024-01-22 DIAGNOSIS — E1151 Type 2 diabetes mellitus with diabetic peripheral angiopathy without gangrene: Secondary | ICD-10-CM | POA: Diagnosis not present

## 2024-01-31 NOTE — Discharge Summary (Signed)
 Patient ID: LIBRADA CASTRONOVO MRN: [id number] DOB/AGE: 72/06/1950 72 y.o.  Admit date: 01/17/2024 Discharge date: 01/18/2024  Admission Diagnoses:  Right hip osteoarthritis  Discharge Diagnoses:  Principal Problem:   S/P total right hip arthroplasty   Past Medical History:  Diagnosis Date   Allergic rhinitis    Allergy    Carotid stenosis    CHF (congestive heart failure) (HCC)    Chronic kidney disease (CKD), stage III (moderate) (HCC)    Constipation    Diabetes mellitus without complication (HCC)    Dyslipidemia    Dyspnea on exertion    Esophageal reflux    GAD (generalized anxiety disorder)    GERD (gastroesophageal reflux disease)    Hip pain    History of kidney stones    Hyperlipidemia    Hypertension    Hypothyroidism    Morbid obesity with alveolar hypoventilation (HCC)    Onychogryposis of toenail    Osteoarthritis of knee    Peripheral arterial disease (HCC)    Pneumonia    PONV (postoperative nausea and vomiting)    Scald burn    Sleep disturbances    Thyroid  disease    Tobacco use    Venous insufficiency     Surgeries: Procedure(s): ARTHROPLASTY, HIP, TOTAL, ANTERIOR APPROACH on 01/17/2024   Consultants:   Discharged Condition: Improved  Hospital Course: MARNEE SHERRARD is an 73 y.o. female who was admitted 01/17/2024 for operative treatment ofS/P total right hip arthroplasty. Patient has severe unremitting pain that affects sleep, daily activities, and work/hobbies. After pre-op clearance the patient was taken to the operating room on 01/17/2024 and underwent  Procedure(s): ARTHROPLASTY, HIP, TOTAL, ANTERIOR APPROACH.    Patient was given perioperative antibiotics:  Anti-infectives (From admission, onward)    Start     Dose/Rate Route Frequency Ordered Stop   01/17/24 1700  ceFAZolin  (ANCEF ) IVPB 2g/100 mL premix        2 g 200 mL/hr over 30 Minutes Intravenous Every 6 hours 01/17/24 1525 01/18/24 0020   01/17/24 0845  ceFAZolin  (ANCEF ) IVPB 2g/100 mL  premix        2 g 200 mL/hr over 30 Minutes Intravenous On call to O.R. 01/17/24 0841 01/17/24 1106        Patient was given sequential compression devices, early ambulation, and chemoprophylaxis to prevent DVT. Patient worked with PT and was meeting their goals regarding safe ambulation and transfers.  Patient benefited maximally from hospital stay and there were no complications.    Recent vital signs: No data found.   Recent laboratory studies: No results for input(s): WBC, HGB, HCT, PLT, NA, K, CL, CO2, BUN, CREATININE, GLUCOSE, INR, CALCIUM  in the last 72 hours.  Invalid input(s): PT, 2   Discharge Medications:   Allergies as of 01/18/2024   No Known Allergies      Medication List     STOP taking these medications    aspirin  EC 81 MG tablet Replaced by: Aspirin  Low Dose 81 MG chewable tablet   meloxicam 15 MG tablet Commonly known as: MOBIC       TAKE these medications    acetaminophen  500 MG tablet Commonly known as: TYLENOL  Take 500-1,000 mg by mouth every 6 (six) hours as needed (pain.).   ALPRAZolam  0.5 MG tablet Commonly known as: XANAX  Take 0.5 mg by mouth at bedtime as needed for anxiety. As needed   Aspirin  Low Dose 81 MG chewable tablet Generic drug: aspirin  Chew 1 tablet (81 mg total) by mouth 2 (  two) times daily for 28 days. Replaces: aspirin  EC 81 MG tablet   busPIRone  5 MG tablet Commonly known as: BUSPAR  Take 5 mg by mouth 2 (two) times daily.   famotidine  20 MG tablet Commonly known as: PEPCID  Take 20 mg by mouth daily as needed for heartburn or indigestion.   furosemide  20 MG tablet Commonly known as: LASIX  Take 20 mg by mouth daily as needed for fluid or edema.   levothyroxine  25 MCG tablet Commonly known as: SYNTHROID  Take 25 mcg by mouth daily before breakfast.   Lubricant Drops/Dual-Action 0.5-0.9 % ophthalmic solution Generic drug: carboxymethylcellul-glycerin Place 1-2 drops into both eyes 3  (three) times daily as needed (dry/irritated eyes.).   metFORMIN  500 MG 24 hr tablet Commonly known as: GLUCOPHAGE -XR Take 500 mg by mouth at bedtime.   methocarbamol  500 MG tablet Commonly known as: ROBAXIN  Take 1 tablet (500 mg total) by mouth every 6 (six) hours as needed for muscle spasms.   Olmesartan -amLODIPine -HCTZ 40-10-25 MG Tabs Take 1 tablet by mouth daily before supper.   omeprazole 40 MG capsule Commonly known as: PRILOSEC Take 40 mg by mouth daily before supper.   oxyCODONE  5 MG immediate release tablet Commonly known as: Oxy IR/ROXICODONE  Take 0.5-1 tablets (2.5-5 mg total) by mouth every 4 (four) hours as needed for severe pain (pain score 7-10).   Ozempic (2 MG/DOSE) 8 MG/3ML Sopn Generic drug: Semaglutide (2 MG/DOSE) Inject 1 mg into the skin every Wednesday.   polyethylene glycol powder 17 GM/SCOOP powder Commonly known as: GLYCOLAX /MIRALAX  Take 17 g dissolved in liquid by mouth 2 (two) times daily.   rosuvastatin  40 MG tablet Commonly known as: CRESTOR  Take 40 mg by mouth daily with lunch.   senna 8.6 MG Tabs tablet Commonly known as: SENOKOT Take 2 tablets (17.2 mg total) by mouth at bedtime for 14 days.               Discharge Care Instructions  (From admission, onward)           Start     Ordered   01/18/24 0000  Change dressing       Comments: Maintain surgical dressing until follow up in the clinic. If the edges start to pull up, may reinforce with tape. If the dressing is no longer working, may remove and cover with gauze and tape, but must keep the area dry and clean.  Call with any questions or concerns.   01/18/24 0751            Diagnostic Studies: DG Pelvis Portable Result Date: 01/17/2024 CLINICAL DATA:  Right hip arthroplasty. EXAM: PORTABLE PELVIS 1-2 VIEWS COMPARISON:  Radiograph 08/16/2023 FINDINGS: Right hip arthroplasty in expected alignment. No periprosthetic lucency or fracture. Recent postsurgical change includes  air and edema in the soft tissues. Vascular calcifications in the pelvis. IMPRESSION: Right hip arthroplasty without immediate postoperative complication. Electronically Signed   By: Andrea Gasman M.D.   On: 01/17/2024 15:29   DG HIP UNILAT WITH PELVIS 1V RIGHT Result Date: 01/17/2024 CLINICAL DATA:  Elective surgery. EXAM: DG HIP (WITH OR WITHOUT PELVIS) 1V RIGHT COMPARISON:  None Available. FINDINGS: Two fluoroscopic spot views of the pelvis and right hip obtained in the operating room. Images during hip arthroplasty. Fluoroscopy time 10 seconds. Dose 1.9033 mGy. IMPRESSION: Intraoperative fluoroscopy during right hip arthroplasty. Electronically Signed   By: Andrea Gasman M.D.   On: 01/17/2024 12:56   DG C-Arm 1-60 Min-No Report Result Date: 01/17/2024 Fluoroscopy was utilized by the  requesting physician.  No radiographic interpretation.    Disposition: Discharge disposition: 01-Home or Self Care       Discharge Instructions     Call MD / Call 911   Complete by: As directed    If you experience chest pain or shortness of breath, CALL 911 and be transported to the hospital emergency room.  If you develope a fever above 101 F, pus (white drainage) or increased drainage or redness at the wound, or calf pain, call your surgeon's office.   Change dressing   Complete by: As directed    Maintain surgical dressing until follow up in the clinic. If the edges start to pull up, may reinforce with tape. If the dressing is no longer working, may remove and cover with gauze and tape, but must keep the area dry and clean.  Call with any questions or concerns.   Constipation Prevention   Complete by: As directed    Drink plenty of fluids.  Prune juice may be helpful.  You may use a stool softener, such as Colace (over the counter) 100 mg twice a day.  Use MiraLax  (over the counter) for constipation as needed.   Diet - low sodium heart healthy   Complete by: As directed    Increase activity  slowly as tolerated   Complete by: As directed    Weight bearing as tolerated with assist device (walker, cane, etc) as directed, use it as long as suggested by your surgeon or therapist, typically at least 4-6 weeks.   Post-operative opioid taper instructions:   Complete by: As directed    POST-OPERATIVE OPIOID TAPER INSTRUCTIONS: It is important to wean off of your opioid medication as soon as possible. If you do not need pain medication after your surgery it is ok to stop day one. Opioids include: Codeine, Hydrocodone (Norco, Vicodin), Oxycodone (Percocet, oxycontin ) and hydromorphone  amongst others.  Long term and even short term use of opiods can cause: Increased pain response Dependence Constipation Depression Respiratory depression And more.  Withdrawal symptoms can include Flu like symptoms Nausea, vomiting And more Techniques to manage these symptoms Hydrate well Eat regular healthy meals Stay active Use relaxation techniques(deep breathing, meditating, yoga) Do Not substitute Alcohol to help with tapering If you have been on opioids for less than two weeks and do not have pain than it is ok to stop all together.  Plan to wean off of opioids This plan should start within one week post op of your joint replacement. Maintain the same interval or time between taking each dose and first decrease the dose.  Cut the total daily intake of opioids by one tablet each day Next start to increase the time between doses. The last dose that should be eliminated is the evening dose.      TED hose   Complete by: As directed    Use stockings (TED hose) for 2 weeks on both leg(s).  You may remove them at night for sleeping.        Follow-up Information     Patti Rosina SAUNDERS, PA-C. Go on 02/01/2024.   Specialty: Orthopedic Surgery Why: You are scheduled for a post op appointment on Wednesday 02/01/24 at 3:15pm Contact information: [address] [place]  [address] [phone]                  Signed: Rosina SAUNDERS Patti 01/31/2024, 9:01 AM

## 2024-02-01 DIAGNOSIS — E1151 Type 2 diabetes mellitus with diabetic peripheral angiopathy without gangrene: Secondary | ICD-10-CM | POA: Diagnosis not present

## 2024-02-01 DIAGNOSIS — Z23 Encounter for immunization: Secondary | ICD-10-CM | POA: Diagnosis not present

## 2024-02-01 DIAGNOSIS — K219 Gastro-esophageal reflux disease without esophagitis: Secondary | ICD-10-CM | POA: Diagnosis not present

## 2024-02-01 DIAGNOSIS — Z96641 Presence of right artificial hip joint: Secondary | ICD-10-CM | POA: Diagnosis not present

## 2024-02-06 ENCOUNTER — Ambulatory Visit

## 2024-02-21 DIAGNOSIS — E1151 Type 2 diabetes mellitus with diabetic peripheral angiopathy without gangrene: Secondary | ICD-10-CM | POA: Diagnosis not present

## 2024-02-21 DIAGNOSIS — I509 Heart failure, unspecified: Secondary | ICD-10-CM | POA: Diagnosis not present

## 2024-02-24 DIAGNOSIS — Z96649 Presence of unspecified artificial hip joint: Secondary | ICD-10-CM | POA: Diagnosis not present

## 2024-02-24 DIAGNOSIS — K2100 Gastro-esophageal reflux disease with esophagitis, without bleeding: Secondary | ICD-10-CM | POA: Diagnosis not present

## 2024-03-07 DIAGNOSIS — Z96641 Presence of right artificial hip joint: Secondary | ICD-10-CM | POA: Diagnosis not present

## 2024-03-07 DIAGNOSIS — Z471 Aftercare following joint replacement surgery: Secondary | ICD-10-CM | POA: Diagnosis not present

## 2024-06-27 ENCOUNTER — Telehealth: Payer: Self-pay

## 2024-06-27 DIAGNOSIS — I509 Heart failure, unspecified: Secondary | ICD-10-CM

## 2024-06-29 ENCOUNTER — Telehealth: Payer: Self-pay | Admitting: *Deleted

## 2024-06-29 NOTE — Progress Notes (Unsigned)
 Complex Care Management Note Care Guide Note  06/29/2024 Name: Heidi Maddox MRN: [id number] DOB: Apr 14, 1951   Complex Care Management Outreach Attempts: An unsuccessful telephone outreach was attempted today to offer the patient information about available complex care management services.  Follow Up Plan:  Additional outreach attempts will be made to offer the patient complex care management information and services.   Encounter Outcome:  No Answer  Harlene Satterfield  [facility]  [facility], [facility] Guide  Direct Dial: [phone]  Fax [phone]
